# Patient Record
Sex: Male | Born: 1970 | Race: White | Hispanic: No | Marital: Married
Health system: Southern US, Community
[De-identification: ages and names within clinical notes are randomized; demographics above are authoritative.]

## PROBLEM LIST (undated history)

## (undated) DIAGNOSIS — E119 Type 2 diabetes mellitus without complications: Secondary | ICD-10-CM

---

## 1997-11-12 ENCOUNTER — Encounter: Admission: RE | Admit: 1997-11-12 | Discharge: 1998-02-10 | Payer: Self-pay

## 2001-05-16 ENCOUNTER — Encounter: Admission: RE | Admit: 2001-05-16 | Discharge: 2001-05-23 | Payer: Self-pay | Admitting: Family Medicine

## 2001-07-16 ENCOUNTER — Encounter
Admission: RE | Admit: 2001-07-16 | Discharge: 2001-08-05 | Payer: Self-pay | Admitting: Physical Medicine and Rehabilitation

## 2003-02-23 ENCOUNTER — Ambulatory Visit (HOSPITAL_COMMUNITY): Admission: RE | Admit: 2003-02-23 | Discharge: 2003-02-23 | Payer: Self-pay | Admitting: Internal Medicine

## 2003-08-31 ENCOUNTER — Ambulatory Visit (HOSPITAL_COMMUNITY): Admission: RE | Admit: 2003-08-31 | Discharge: 2003-08-31 | Payer: Self-pay | Admitting: Family Medicine

## 2004-06-14 ENCOUNTER — Ambulatory Visit: Payer: Self-pay | Admitting: Family Medicine

## 2004-07-05 ENCOUNTER — Ambulatory Visit: Payer: Self-pay | Admitting: Family Medicine

## 2004-08-22 ENCOUNTER — Ambulatory Visit: Payer: Self-pay | Admitting: Family Medicine

## 2004-09-12 ENCOUNTER — Ambulatory Visit: Payer: Self-pay | Admitting: Family Medicine

## 2004-10-06 ENCOUNTER — Ambulatory Visit: Payer: Self-pay | Admitting: Family Medicine

## 2004-11-15 ENCOUNTER — Ambulatory Visit: Payer: Self-pay | Admitting: Family Medicine

## 2005-01-04 ENCOUNTER — Ambulatory Visit: Payer: Self-pay | Admitting: Family Medicine

## 2005-04-06 ENCOUNTER — Ambulatory Visit: Payer: Self-pay | Admitting: Family Medicine

## 2005-06-13 ENCOUNTER — Encounter: Admission: RE | Admit: 2005-06-13 | Discharge: 2005-06-29 | Payer: Self-pay | Admitting: Family Medicine

## 2005-07-14 ENCOUNTER — Ambulatory Visit (HOSPITAL_COMMUNITY): Admission: RE | Admit: 2005-07-14 | Discharge: 2005-07-14 | Payer: Self-pay | Admitting: Family Medicine

## 2007-03-14 ENCOUNTER — Encounter: Admission: RE | Admit: 2007-03-14 | Discharge: 2007-06-12 | Payer: Self-pay | Admitting: Family Medicine

## 2008-09-23 ENCOUNTER — Encounter
Admission: RE | Admit: 2008-09-23 | Discharge: 2008-10-27 | Payer: Self-pay | Admitting: Physical Medicine & Rehabilitation

## 2008-09-25 ENCOUNTER — Ambulatory Visit: Payer: Self-pay | Admitting: Physical Medicine & Rehabilitation

## 2008-10-06 ENCOUNTER — Encounter
Admission: RE | Admit: 2008-10-06 | Discharge: 2008-10-12 | Payer: Self-pay | Admitting: Physical Medicine & Rehabilitation

## 2008-10-12 ENCOUNTER — Emergency Department (HOSPITAL_COMMUNITY): Admission: EM | Admit: 2008-10-12 | Discharge: 2008-10-12 | Payer: Self-pay | Admitting: Emergency Medicine

## 2008-10-19 ENCOUNTER — Encounter: Admission: RE | Admit: 2008-10-19 | Discharge: 2008-10-20 | Payer: Self-pay | Admitting: Anesthesiology

## 2008-10-20 ENCOUNTER — Ambulatory Visit: Payer: Self-pay | Admitting: Anesthesiology

## 2008-10-27 ENCOUNTER — Ambulatory Visit: Payer: Self-pay | Admitting: Physical Medicine & Rehabilitation

## 2009-08-03 ENCOUNTER — Encounter: Admission: RE | Admit: 2009-08-03 | Discharge: 2009-08-12 | Payer: Self-pay | Admitting: Neurosurgery

## 2009-08-16 ENCOUNTER — Encounter: Admission: RE | Admit: 2009-08-16 | Discharge: 2009-11-14 | Payer: Self-pay | Admitting: Neurosurgery

## 2009-09-06 ENCOUNTER — Emergency Department (HOSPITAL_COMMUNITY): Admission: EM | Admit: 2009-09-06 | Discharge: 2009-09-06 | Payer: Self-pay | Admitting: Emergency Medicine

## 2010-06-16 ENCOUNTER — Encounter
Admission: RE | Admit: 2010-06-16 | Discharge: 2010-08-11 | Payer: Self-pay | Source: Home / Self Care | Attending: Family Medicine | Admitting: Family Medicine

## 2010-08-14 ENCOUNTER — Encounter: Admission: RE | Admit: 2010-08-14 | Payer: Self-pay | Source: Home / Self Care | Admitting: Family Medicine

## 2010-09-03 ENCOUNTER — Encounter: Payer: Self-pay | Admitting: Family Medicine

## 2010-10-30 LAB — BASIC METABOLIC PANEL
BUN: 8 mg/dL (ref 6–23)
CO2: 24 mEq/L (ref 19–32)
Calcium: 9.4 mg/dL (ref 8.4–10.5)
Chloride: 101 mEq/L (ref 96–112)
Creatinine, Ser: 0.83 mg/dL (ref 0.4–1.5)
GFR calc Af Amer: >60 mL/min (ref >60)
GFR calc non Af Amer: >60 mL/min (ref >60)
Glucose, Bld: 119 mg/dL — ABNORMAL HIGH (ref 70–99)
Potassium: 3.9 mEq/L (ref 3.5–5.1)
Sodium: 133 mEq/L — ABNORMAL LOW (ref 135–145)

## 2010-10-30 LAB — DIFFERENTIAL
Basophils Absolute: 0 10*3/uL (ref 0.0–0.1)
Basophils Relative: 0 % (ref 0–1)
Eosinophils Absolute: 0 10*3/uL (ref 0.0–0.7)
Eosinophils Relative: 1 % (ref 0–5)
Lymphocytes Relative: 16 % (ref 12–46)
Lymphs Abs: 1.5 10*3/uL (ref 0.7–4.0)
Monocytes Absolute: 0.7 10*3/uL (ref 0.1–1.0)
Monocytes Relative: 8 % (ref 3–12)
Neutro Abs: 7.1 10*3/uL (ref 1.7–7.7)
Neutrophils Relative %: 75 % (ref 43–77)

## 2010-10-30 LAB — CBC
HCT: 48.4 % (ref 39.0–52.0)
Hemoglobin: 16.5 g/dL (ref 13.0–17.0)
MCHC: 34.2 g/dL (ref 30.0–36.0)
MCV: 88.5 fL (ref 78.0–100.0)
Platelets: 239 10*3/uL (ref 150–400)
RBC: 5.46 MIL/uL (ref 4.22–5.81)
RDW: 13.2 % (ref 11.5–15.5)
WBC: 9.4 10*3/uL (ref 4.0–10.5)

## 2010-10-30 LAB — POCT CARDIAC MARKERS
CKMB, poc: <1 ng/mL — ABNORMAL LOW (ref 1.0–8.0)
CKMB, poc: <1 ng/mL — ABNORMAL LOW (ref 1.0–8.0)
Myoglobin, poc: 60.3 ng/mL (ref 12–200)
Myoglobin, poc: 64.1 ng/mL (ref 12–200)
Troponin i, poc: <0.05 ng/mL (ref 0.00–0.09)
Troponin i, poc: <0.05 ng/mL (ref 0.00–0.09)

## 2010-10-30 LAB — PROTIME-INR
INR: 0.94 (ref 0.00–1.49)
Prothrombin Time: 12.5 seconds (ref 11.6–15.2)
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
## 2010-12-27 NOTE — Assessment & Plan Note (Signed)
Alexander Torres follows up today.  Alexander Torres had cervical facet injections per Dr.  Stevphen Rochester on October 20, 2008, which was not particularly effective.  Has a  history of cervical spine pain.  MRI showing minimal spinal stenosis at  C7-8, some left-sided stenosis in upper levels, but none of this severe.  Nerve conduction studies showed some carpal tunnel type findings, but no  other signs of cervical radiculopathy.   Alexander Torres is wanting to go back to work and is wondering whether this would be  deleterious to his condition.   His pain is about 5 currently, interferes with activity at a 6.  Pain  does improve with rest, heat, medications.  Alexander Torres walks 20 minutes at a  time, climbs steps.  Alexander Torres drives.  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  Alexander Torres has some difficulty with certain shopping and  household duties.   His review of systems is positive for numbness, tingling, spasms,  anxiety, but not depression.   Past history of diabetes.   SOCIAL HISTORY:  Married.  Smokes a pack per day.  Denies any alcohol or  drug use.  Urine drug screen performed a month ago was negative for any  illicit drugs or nondisclosed opiates.   Alexander Torres has not done well with non-narcotic agents, but has had reasonable  relief with hydrocodone small doses, i.e. 5/325 t.i.d.   IMPRESSION:  Cervicalgia.  Alexander Torres has some cervical spondylosis without  myelopathy while Alexander Torres may experience some exacerbation of pain when Alexander Torres  first goes back to work, I do not think it will in the long-term cause  any additional degenerative changes in his neck.  We will write for some  hydrocodone 5/325, try to avoid this usage during work hours.  Alexander Torres takes  Skelaxin 800 t.i.d.  I will see him back in couple months, discussed  that cervical spine problems in terms of pain can take several months to  improve.  Alexander Torres already has some exercises from physical therapy.  We will  follow him up in 2 months.      Erick Colace, M.D.  Electronically  Signed     AEK/MedQ  D:  10/27/2008 16:51:52  T:  10/28/2008 03:56:55  Job #:  [id number]   cc:   Dr. Felipe Drone

## 2010-12-27 NOTE — Procedures (Signed)
Alexander Torres                 ACCOUNT NO.:  [id number]   MEDICAL RECORD NO.:  [id number]          PATIENT TYPE:  REC   LOCATION:  [facility]                          FACILITY:  [facility]   PHYSICIAN:  Celene Kras, MD        DATE OF BIRTH:  December 09, 1970   DATE OF PROCEDURE:  DATE OF DISCHARGE:                               OPERATIVE REPORT   [facility].  I evaluated  him and reviewed the Health and history form and 14-point review of  systems.   1. He described it is spondylytic pain, particularly to the right,      difficulty with endurance and range of motion activities.  He works      as a Production designer, theatre/television/film man, up and down ladders, the like, and cautions      with this type of work, review of medications and cautions there.      Rationale to perform intervention to minimize escalation of      controlled substances, and to remain as functional as possible.  2. Modifiable features in health profile such as cigarette cessation      is strongly urged.  3. Review of imaging and progress to date.  He has spondylytic pain,      minimal arm pain, directed to the right, consistent with 5, 6, and      7; 4 will be blocked as contributory innervation, and I have given      him benchmarks 50-75%, and RF could be entertained down the road.      I will see him back in 2-4 weeks to determine further course of      care.  He is consented for today's procedure.  I have used models      and discussed in lay terms.   Objectively, diffuse paracervical myofascial discomfort with positive  cervical facet compression test, right greater than left with  suprascapular and levator scapular pain.  Nothing new neurologically,  spondylytic pain, minimal myelopathy.   IMPRESSION:  Spondylosis.   PLAN:  Facet medial branch intervention 4, 5, 6, and 7, right side under  local anesthetic and he is consented.  Predicated further intervention  based on need and overall  response, independent needle access point.   The patient was taken to the fluoroscopy suite and placed in supine  position.  The neck was prepped and draped in the usual fashion.  Using  a 25-gauge needle, I advanced to the facet at the medial branch of C4,  5, 6, and 7, right side under local anesthetic.  Confirmed placement and  injected 0.5 mL of lidocaine, 1% MPF at each level with a total 40 mg of  Aristocort in divided dose.   He tolerated the procedure well.  No complications from our procedures,  except within the context of activities of daily living.           ______________________________  Celene Kras, MD     HH/MEDQ  D:  10/20/2008 12:10:47  T:  10/20/2008 23:04:01  Job:  [id number]

## 2010-12-30 NOTE — Op Note (Signed)
NAME:  Alexander Torres, Alexander Torres                           ACCOUNT NO.:  [id number]   MEDICAL RECORD NO.:  [id number]                   PATIENT TYPE:  AMB   LOCATION:  DAY                                  FACILITY:  [facility]   PHYSICIAN:  Lionel December, M.D.                 DATE OF BIRTH:  Feb 18, 1971   DATE OF PROCEDURE:  02/23/2003  DATE OF DISCHARGE:                                 OPERATIVE REPORT   PROCEDURE:   ENDOSCOPIST:  Lionel December, M.D.   INDICATIONS:  [name] is a 40 year old Caucasian male with a several year  history of GERD who has been on therapy for 6-7 years.  He is on Nexium 40  mg a day and still having some epigastric pain as well as chest discomfort  and postprandial nausea, although he is a lot better since he has been  switched from 40 to Nexium.  Recent ultrasound was normal.  He is undergoing  EGD to make sure that he does not have Barrett's esophagus or a large hernia  in which case management may be different.  The procedure and risks were  reviewed with the patient and informed consent was obtained.   PREOPERATIVE MEDICATIONS:  Cetacaine spray for oropharyngeal topical  anesthesia, Demerol 50 mg IV and Versed 10 mg IV in divided dose.   FINDINGS:  Procedure performed in endoscopy suite.  The patient's vital  signs and O2 saturation were monitored during the procedure and remained  stable.  The patient was placed in the left lateral recumbent position and  Olympus videoscope was passed via the oropharynx without any difficulty into  the esophagus.   ESOPHAGUS:  Mucosa of the esophagus normal except there is focal erythema at  GE junction.  There were no erosions or ulcers noted.  There was a small  sliding hiatal hernia, no more than 3 cm in length.   STOMACH:  It was empty and distended very well with insufflation.  The folds  of the proximal stomach were normal.  Examination of the mucosa at body,  antrum, pyloric channel, as well as angularis, fundus, and cardia  was  normal.   DUODENUM:  Examination of the bulb revealed normal mucosa.  The scope was  passed into the second part of the duodenum where the mucosa and folds were  normal.   The endoscope was withdrawn.  The patient tolerated the procedure well.   FINAL DIAGNOSES:  1. 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  No evidence of Barrett's esophagus.  2. Normal examination of stomach and first and second part of the duodenum.    RECOMMENDATIONS:  1. He will continue antireflux measures on Nexium at 40 mg p.o. q.a.m.  2. He will return for OV if he continues to have breakthrough symptoms.  Lionel December, M.D.    NR/MEDQ  D:  02/23/2003  T:  02/23/2003  Job:  [id number]   cc:   Delaney Meigs, M.D.  [address]  [place]  [place] [address]  Fax: [phone]

## 2012-10-29 ENCOUNTER — Ambulatory Visit: Payer: BC Managed Care – PPO | Attending: Family Medicine | Admitting: Physical Therapy

## 2012-10-29 DIAGNOSIS — R5381 Other malaise: Secondary | ICD-10-CM | POA: Insufficient documentation

## 2012-10-29 DIAGNOSIS — IMO0001 Reserved for inherently not codable concepts without codable children: Secondary | ICD-10-CM | POA: Insufficient documentation

## 2012-10-29 DIAGNOSIS — M545 Low back pain, unspecified: Secondary | ICD-10-CM | POA: Insufficient documentation

## 2012-11-04 ENCOUNTER — Encounter: Payer: BC Managed Care – PPO | Admitting: Physical Therapy

## 2013-01-15 ENCOUNTER — Ambulatory Visit: Payer: PRIVATE HEALTH INSURANCE | Attending: Family Medicine | Admitting: Physical Therapy

## 2013-01-15 DIAGNOSIS — M545 Low back pain, unspecified: Secondary | ICD-10-CM | POA: Insufficient documentation

## 2013-01-15 DIAGNOSIS — IMO0001 Reserved for inherently not codable concepts without codable children: Secondary | ICD-10-CM | POA: Insufficient documentation

## 2013-01-15 DIAGNOSIS — R5381 Other malaise: Secondary | ICD-10-CM | POA: Insufficient documentation

## 2013-01-15 DIAGNOSIS — Z9181 History of falling: Secondary | ICD-10-CM | POA: Insufficient documentation

## 2013-02-10 DIAGNOSIS — R079 Chest pain, unspecified: Secondary | ICD-10-CM

## 2017-10-16 ENCOUNTER — Encounter (HOSPITAL_COMMUNITY): Payer: Self-pay | Admitting: Cardiovascular Disease

## 2017-10-16 ENCOUNTER — Inpatient Hospital Stay (HOSPITAL_COMMUNITY)
Admission: EM | Admit: 2017-10-16 | Discharge: 2017-11-12 | DRG: 246 | Disposition: E | Payer: BC Managed Care – PPO | Source: Ambulatory Visit | Attending: Pulmonary Disease | Admitting: Pulmonary Disease

## 2017-10-16 ENCOUNTER — Inpatient Hospital Stay (HOSPITAL_COMMUNITY): Payer: BC Managed Care – PPO

## 2017-10-16 ENCOUNTER — Encounter (HOSPITAL_COMMUNITY): Admission: EM | Disposition: E | Payer: Self-pay | Source: Ambulatory Visit | Attending: Pulmonary Disease

## 2017-10-16 DIAGNOSIS — Z8674 Personal history of sudden cardiac arrest: Secondary | ICD-10-CM

## 2017-10-16 DIAGNOSIS — I340 Nonrheumatic mitral (valve) insufficiency: Secondary | ICD-10-CM | POA: Diagnosis not present

## 2017-10-16 DIAGNOSIS — E785 Hyperlipidemia, unspecified: Secondary | ICD-10-CM | POA: Diagnosis present

## 2017-10-16 DIAGNOSIS — E1165 Type 2 diabetes mellitus with hyperglycemia: Secondary | ICD-10-CM | POA: Diagnosis present

## 2017-10-16 DIAGNOSIS — R569 Unspecified convulsions: Secondary | ICD-10-CM | POA: Diagnosis present

## 2017-10-16 DIAGNOSIS — I472 Ventricular tachycardia: Secondary | ICD-10-CM | POA: Diagnosis not present

## 2017-10-16 DIAGNOSIS — R402212 Coma scale, best verbal response, none, at arrival to emergency department: Secondary | ICD-10-CM | POA: Diagnosis present

## 2017-10-16 DIAGNOSIS — M542 Cervicalgia: Secondary | ICD-10-CM | POA: Diagnosis present

## 2017-10-16 DIAGNOSIS — I469 Cardiac arrest, cause unspecified: Secondary | ICD-10-CM

## 2017-10-16 DIAGNOSIS — I255 Ischemic cardiomyopathy: Secondary | ICD-10-CM | POA: Diagnosis present

## 2017-10-16 DIAGNOSIS — N179 Acute kidney failure, unspecified: Secondary | ICD-10-CM | POA: Diagnosis not present

## 2017-10-16 DIAGNOSIS — I4901 Ventricular fibrillation: Secondary | ICD-10-CM | POA: Diagnosis not present

## 2017-10-16 DIAGNOSIS — R6521 Severe sepsis with septic shock: Secondary | ICD-10-CM | POA: Diagnosis not present

## 2017-10-16 DIAGNOSIS — R001 Bradycardia, unspecified: Secondary | ICD-10-CM | POA: Diagnosis not present

## 2017-10-16 DIAGNOSIS — R402112 Coma scale, eyes open, never, at arrival to emergency department: Secondary | ICD-10-CM | POA: Diagnosis present

## 2017-10-16 DIAGNOSIS — J9690 Respiratory failure, unspecified, unspecified whether with hypoxia or hypercapnia: Secondary | ICD-10-CM

## 2017-10-16 DIAGNOSIS — S2241XA Multiple fractures of ribs, right side, initial encounter for closed fracture: Secondary | ICD-10-CM | POA: Diagnosis present

## 2017-10-16 DIAGNOSIS — G8929 Other chronic pain: Secondary | ICD-10-CM | POA: Diagnosis present

## 2017-10-16 DIAGNOSIS — A419 Sepsis, unspecified organism: Secondary | ICD-10-CM | POA: Diagnosis not present

## 2017-10-16 DIAGNOSIS — J80 Acute respiratory distress syndrome: Secondary | ICD-10-CM | POA: Diagnosis not present

## 2017-10-16 DIAGNOSIS — R34 Anuria and oliguria: Secondary | ICD-10-CM | POA: Diagnosis not present

## 2017-10-16 DIAGNOSIS — I2510 Atherosclerotic heart disease of native coronary artery without angina pectoris: Secondary | ICD-10-CM | POA: Diagnosis present

## 2017-10-16 DIAGNOSIS — J9601 Acute respiratory failure with hypoxia: Secondary | ICD-10-CM | POA: Diagnosis present

## 2017-10-16 DIAGNOSIS — G40901 Epilepsy, unspecified, not intractable, with status epilepticus: Secondary | ICD-10-CM | POA: Diagnosis not present

## 2017-10-16 DIAGNOSIS — J9600 Acute respiratory failure, unspecified whether with hypoxia or hypercapnia: Secondary | ICD-10-CM

## 2017-10-16 DIAGNOSIS — I4891 Unspecified atrial fibrillation: Secondary | ICD-10-CM | POA: Diagnosis present

## 2017-10-16 DIAGNOSIS — J690 Pneumonitis due to inhalation of food and vomit: Secondary | ICD-10-CM | POA: Diagnosis not present

## 2017-10-16 DIAGNOSIS — F17210 Nicotine dependence, cigarettes, uncomplicated: Secondary | ICD-10-CM | POA: Diagnosis present

## 2017-10-16 DIAGNOSIS — E872 Acidosis: Secondary | ICD-10-CM | POA: Diagnosis not present

## 2017-10-16 DIAGNOSIS — G931 Anoxic brain damage, not elsewhere classified: Secondary | ICD-10-CM | POA: Diagnosis present

## 2017-10-16 DIAGNOSIS — X58XXXA Exposure to other specified factors, initial encounter: Secondary | ICD-10-CM | POA: Diagnosis present

## 2017-10-16 DIAGNOSIS — I2119 ST elevation (STEMI) myocardial infarction involving other coronary artery of inferior wall: Principal | ICD-10-CM | POA: Diagnosis present

## 2017-10-16 DIAGNOSIS — Z9289 Personal history of other medical treatment: Secondary | ICD-10-CM

## 2017-10-16 DIAGNOSIS — I213 ST elevation (STEMI) myocardial infarction of unspecified site: Secondary | ICD-10-CM

## 2017-10-16 DIAGNOSIS — R402312 Coma scale, best motor response, none, at arrival to emergency department: Secondary | ICD-10-CM | POA: Diagnosis present

## 2017-10-16 DIAGNOSIS — E874 Mixed disorder of acid-base balance: Secondary | ICD-10-CM | POA: Diagnosis not present

## 2017-10-16 DIAGNOSIS — I2121 ST elevation (STEMI) myocardial infarction involving left circumflex coronary artery: Secondary | ICD-10-CM

## 2017-10-16 DIAGNOSIS — Z880 Allergy status to penicillin: Secondary | ICD-10-CM

## 2017-10-16 DIAGNOSIS — R0902 Hypoxemia: Secondary | ICD-10-CM

## 2017-10-16 HISTORY — PX: LEFT HEART CATH AND CORONARY ANGIOGRAPHY: CATH118249

## 2017-10-16 HISTORY — PX: CORONARY/GRAFT ACUTE MI REVASCULARIZATION: CATH118305

## 2017-10-16 HISTORY — DX: Type 2 diabetes mellitus without complications: E11.9

## 2017-10-16 LAB — PROTIME-INR
INR: 1.09
INR: 1.23
Prothrombin Time: 14 seconds (ref 11.4–15.2)
Prothrombin Time: 15.4 seconds — ABNORMAL HIGH (ref 11.4–15.2)

## 2017-10-16 LAB — GLUCOSE, CAPILLARY
Glucose-Capillary: 157 mg/dL — ABNORMAL HIGH (ref 65–99)
Glucose-Capillary: 135 mg/dL — ABNORMAL HIGH (ref 65–99)
Glucose-Capillary: 154 mg/dL — ABNORMAL HIGH (ref 65–99)
Glucose-Capillary: 140 mg/dL — ABNORMAL HIGH (ref 65–99)

## 2017-10-16 LAB — ECHOCARDIOGRAM COMPLETE
HEIGHTINCHES: 71.5 in
Weight: 3880.1 oz

## 2017-10-16 LAB — HIV ANTIBODY (ROUTINE TESTING W REFLEX): HIV SCREEN 4TH GENERATION: NONREACTIVE

## 2017-10-16 LAB — BASIC METABOLIC PANEL
Anion gap: 19 — ABNORMAL HIGH (ref 5–15)
Anion gap: 16 — ABNORMAL HIGH (ref 5–15)
BUN: 7 mg/dL (ref 6–20)
BUN: 8 mg/dL (ref 6–20)
CALCIUM: 9 mg/dL (ref 8.9–10.3)
CHLORIDE: 103 mmol/L (ref 101–111)
CO2: 15 mmol/L — AB (ref 22–32)
CO2: 18 mmol/L — AB (ref 22–32)
CREATININE: 1.09 mg/dL (ref 0.61–1.24)
Calcium: 8.7 mg/dL — ABNORMAL LOW (ref 8.9–10.3)
Chloride: 104 mmol/L (ref 101–111)
Creatinine, Ser: 0.83 mg/dL (ref 0.61–1.24)
GFR calc non Af Amer: >60 mL/min (ref >60)
GFR calc non Af Amer: >60 mL/min (ref >60)
GLUCOSE: 260 mg/dL — AB (ref 65–99)
Glucose, Bld: 165 mg/dL — ABNORMAL HIGH (ref 65–99)
Potassium: 3.9 mmol/L (ref 3.5–5.1)
Potassium: 3.8 mmol/L (ref 3.5–5.1)
Sodium: 137 mmol/L (ref 135–145)
Sodium: 138 mmol/L (ref 135–145)

## 2017-10-16 LAB — HEMOGLOBIN A1C
Hgb A1c MFr Bld: 6.3 % — ABNORMAL HIGH (ref 4.8–5.6)
Mean Plasma Glucose: 134.11 mg/dL

## 2017-10-16 LAB — CBC WITH DIFFERENTIAL/PLATELET
BASOS ABS: 0 10*3/uL (ref 0.0–0.1)
BASOS PCT: 0 %
Eosinophils Absolute: 0 10*3/uL (ref 0.0–0.7)
Eosinophils Relative: 0 %
HCT: 42 % (ref 39.0–52.0)
Hemoglobin: 14.2 g/dL (ref 13.0–17.0)
Lymphocytes Relative: 12 %
Lymphs Abs: 3.6 10*3/uL (ref 0.7–4.0)
MCH: 30.7 pg (ref 26.0–34.0)
MCHC: 33.8 g/dL (ref 30.0–36.0)
MCV: 90.9 fL (ref 78.0–100.0)
MONO ABS: 1.5 10*3/uL — AB (ref 0.1–1.0)
Monocytes Relative: 5 %
NEUTROS PCT: 83 %
Neutro Abs: 24.9 10*3/uL — ABNORMAL HIGH (ref 1.7–7.7)
PLATELETS: 357 10*3/uL (ref 150–400)
RBC: 4.62 MIL/uL (ref 4.22–5.81)
RDW: 13.7 % (ref 11.5–15.5)
WBC: 30 10*3/uL — ABNORMAL HIGH (ref 4.0–10.5)

## 2017-10-16 LAB — POCT I-STAT 3, ART BLOOD GAS (G3+)
ACID-BASE DEFICIT: 3 mmol/L — AB (ref 0.0–2.0)
Acid-base deficit: 8 mmol/L — ABNORMAL HIGH (ref 0.0–2.0)
BICARBONATE: 18.5 mmol/L — AB (ref 20.0–28.0)
BICARBONATE: 21.3 mmol/L (ref 20.0–28.0)
O2 SAT: 98 %
O2 Saturation: 100 %
PCO2 ART: 39.6 mmHg (ref 32.0–48.0)
PH ART: 7.272 — AB (ref 7.350–7.450)
PO2 ART: 95 mmHg (ref 83.0–108.0)
Patient temperature: 96.8
TCO2: 20 mmol/L — ABNORMAL LOW (ref 22–32)
TCO2: 22 mmol/L (ref 22–32)
pCO2 arterial: 34.1 mmHg (ref 32.0–48.0)
pH, Arterial: 7.398 (ref 7.350–7.450)
pO2, Arterial: 229 mmHg — ABNORMAL HIGH (ref 83.0–108.0)

## 2017-10-16 LAB — TROPONIN I
TROPONIN I: 0.39 ng/mL — AB (ref <0.03)
Troponin I: 22.17 ng/mL (ref <0.03)
Troponin I: 47.59 ng/mL (ref <0.03)
Troponin I: 39.1 ng/mL (ref <0.03)

## 2017-10-16 LAB — POCT ACTIVATED CLOTTING TIME: Activated Clotting Time: 802 seconds

## 2017-10-16 LAB — MRSA PCR SCREENING: MRSA by PCR: NEGATIVE

## 2017-10-16 LAB — LIPID PANEL
CHOL/HDL RATIO: 7.8 ratio
CHOLESTEROL: 226 mg/dL — AB (ref 0–200)
HDL: 29 mg/dL — ABNORMAL LOW (ref >40)
LDL Cholesterol: 167 mg/dL — ABNORMAL HIGH (ref 0–99)
Triglycerides: 148 mg/dL (ref <150)
VLDL: 30 mg/dL (ref 0–40)

## 2017-10-16 LAB — HEPATIC FUNCTION PANEL
ALBUMIN: 3.6 g/dL (ref 3.5–5.0)
ALK PHOS: 68 U/L (ref 38–126)
ALT: 61 U/L (ref 17–63)
AST: 129 U/L — AB (ref 15–41)
BILIRUBIN TOTAL: 0.7 mg/dL (ref 0.3–1.2)
Total Protein: 6.3 g/dL — ABNORMAL LOW (ref 6.5–8.1)

## 2017-10-16 LAB — RAPID URINE DRUG SCREEN, HOSP PERFORMED
Amphetamines: NOT DETECTED
BENZODIAZEPINES: POSITIVE — AB
Barbiturates: NOT DETECTED
Cocaine: NOT DETECTED
Opiates: NOT DETECTED
Tetrahydrocannabinol: NOT DETECTED

## 2017-10-16 LAB — PHOSPHORUS: PHOSPHORUS: 5.4 mg/dL — AB (ref 2.5–4.6)

## 2017-10-16 LAB — APTT
aPTT: 31 seconds (ref 24–36)
aPTT: 48 seconds — ABNORMAL HIGH (ref 24–36)

## 2017-10-16 LAB — MAGNESIUM: Magnesium: 1.9 mg/dL (ref 1.7–2.4)

## 2017-10-16 SURGERY — CORONARY/GRAFT ACUTE MI REVASCULARIZATION
Anesthesia: LOCAL

## 2017-10-16 MED ORDER — AMIODARONE LOAD VIA INFUSION
150.0000 mg | Freq: Once | INTRAVENOUS | Status: AC
  Administered 2017-10-16: 150 mg via INTRAVENOUS
  Filled 2017-10-16: qty 83.34

## 2017-10-16 MED ORDER — LABETALOL HCL 5 MG/ML IV SOLN: 10.0000 mg | INTRAVENOUS | Status: AC | PRN

## 2017-10-16 MED ORDER — SODIUM CHLORIDE 0.9 % IV SOLN
INTRAVENOUS | Status: DC
  Administered 2017-10-16 – 2017-10-18 (×2): via INTRAVENOUS

## 2017-10-16 MED ORDER — SODIUM CHLORIDE 0.9% FLUSH
3.0000 mL | INTRAVENOUS | Status: DC | PRN
Start: 2017-10-16 — End: 2017-10-16

## 2017-10-16 MED ORDER — ATORVASTATIN CALCIUM 80 MG PO TABS
80.0000 mg | ORAL_TABLET | Freq: Every day | ORAL | Status: DC
  Administered 2017-10-16 – 2017-10-18 (×3): 80 mg via ORAL
  Filled 2017-10-16 (×3): qty 1

## 2017-10-16 MED ORDER — CANGRELOR TETRASODIUM 50 MG IV SOLR
INTRAVENOUS | Status: AC
  Filled 2017-10-16: qty 50

## 2017-10-16 MED ORDER — SODIUM CHLORIDE 0.9 % IV SOLN: 250.0000 mL | INTRAVENOUS | Status: DC | PRN

## 2017-10-16 MED ORDER — ONDANSETRON HCL 4 MG/2ML IJ SOLN: 4.0000 mg | Freq: Four times a day (QID) | INTRAMUSCULAR | Status: DC | PRN

## 2017-10-16 MED ORDER — LEVETIRACETAM IN NACL 1000 MG/100ML IV SOLN
1000.0000 mg | Freq: Two times a day (BID) | INTRAVENOUS | Status: DC
  Administered 2017-10-16 – 2017-10-17 (×2): 1000 mg via INTRAVENOUS
  Filled 2017-10-16 (×5): qty 100

## 2017-10-16 MED ORDER — DOCUSATE SODIUM 50 MG/5ML PO LIQD: 100.0000 mg | Freq: Two times a day (BID) | ORAL | Status: DC | PRN

## 2017-10-16 MED ORDER — CHLORHEXIDINE GLUCONATE 0.12% ORAL RINSE (MEDLINE KIT)
15.0000 mL | Freq: Two times a day (BID) | OROMUCOSAL | Status: DC
  Administered 2017-10-16 – 2017-10-18 (×5): 15 mL via OROMUCOSAL

## 2017-10-16 MED ORDER — SENNOSIDES 8.8 MG/5ML PO SYRP
5.0000 mL | ORAL_SOLUTION | Freq: Every day | ORAL | Status: DC
  Administered 2017-10-16 – 2017-10-18 (×3): 5 mL
  Filled 2017-10-16 (×3): qty 5

## 2017-10-16 MED ORDER — BIVALIRUDIN BOLUS VIA INFUSION - CUPID
INTRAVENOUS | Status: DC | PRN
  Administered 2017-10-16: 82.5 mg via INTRAVENOUS

## 2017-10-16 MED ORDER — SODIUM CHLORIDE 0.9 % IV SOLN
INTRAVENOUS | Status: DC | PRN
  Administered 2017-10-16: 1.75 mg/kg/h via INTRAVENOUS

## 2017-10-16 MED ORDER — FENTANYL 2500MCG IN NS 250ML (10MCG/ML) PREMIX INFUSION
0.0000 ug/h | INTRAVENOUS | Status: DC
  Administered 2017-10-16: 200 ug/h via INTRAVENOUS
  Administered 2017-10-16 – 2017-10-17 (×2): 100 ug/h via INTRAVENOUS
  Administered 2017-10-17: 250 ug/h via INTRAVENOUS
  Administered 2017-10-18 (×2): 200 ug/h via INTRAVENOUS
  Filled 2017-10-16 (×10): qty 250

## 2017-10-16 MED ORDER — MAGNESIUM SULFATE 2 GM/50ML IV SOLN
2.0000 g | Freq: Once | INTRAVENOUS | Status: AC
  Administered 2017-10-16: 2 g via INTRAVENOUS
  Filled 2017-10-16: qty 50

## 2017-10-16 MED ORDER — ONDANSETRON HCL 4 MG/2ML IJ SOLN
INTRAMUSCULAR | Status: AC
  Filled 2017-10-16: qty 2

## 2017-10-16 MED ORDER — LIDOCAINE HCL (CARDIAC) 20 MG/ML IV SOLN
INTRAVENOUS | Status: DC | PRN
  Administered 2017-10-16: 100 mg via INTRAVENOUS

## 2017-10-16 MED ORDER — CISATRACURIUM BESYLATE (PF) 200 MG/20ML IV SOLN
1.0000 ug/kg/min | INTRAVENOUS | Status: DC
  Filled 2017-10-16: qty 20

## 2017-10-16 MED ORDER — CISATRACURIUM BOLUS VIA INFUSION
0.0500 mg/kg | INTRAVENOUS | Status: DC | PRN
  Filled 2017-10-16: qty 6

## 2017-10-16 MED ORDER — PROPOFOL 1000 MG/100ML IV EMUL
INTRAVENOUS | Status: AC
  Administered 2017-10-17: 30 ug/kg/min via INTRAVENOUS
  Filled 2017-10-16: qty 100

## 2017-10-16 MED ORDER — ASPIRIN 300 MG RE SUPP
300.0000 mg | RECTAL | Status: DC
  Filled 2017-10-16: qty 1

## 2017-10-16 MED ORDER — PERFLUTREN LIPID MICROSPHERE
1.0000 mL | INTRAVENOUS | Status: AC | PRN
  Administered 2017-10-16: 5 mL via INTRAVENOUS
  Filled 2017-10-16: qty 10

## 2017-10-16 MED ORDER — ASPIRIN 81 MG PO CHEW
81.0000 mg | CHEWABLE_TABLET | Freq: Every day | ORAL | Status: DC
  Administered 2017-10-17 – 2017-10-18 (×2): 81 mg via ORAL
  Filled 2017-10-16 (×2): qty 1

## 2017-10-16 MED ORDER — HYDRALAZINE HCL 20 MG/ML IJ SOLN: 5.0000 mg | INTRAMUSCULAR | Status: AC | PRN

## 2017-10-16 MED ORDER — PROPOFOL BOLUS VIA INFUSION
INTRAVENOUS | Status: DC | PRN
  Administered 2017-10-16 (×2): 20 mg via INTRAVENOUS

## 2017-10-16 MED ORDER — LORAZEPAM 2 MG/ML IJ SOLN
4.0000 mg | Freq: Once | INTRAMUSCULAR | Status: AC
  Administered 2017-10-16: 4 mg via INTRAVENOUS

## 2017-10-16 MED ORDER — AMIODARONE HCL 150 MG/3ML IV SOLN
INTRAVENOUS | Status: AC
  Filled 2017-10-16: qty 3

## 2017-10-16 MED ORDER — SODIUM CHLORIDE 0.9% FLUSH
3.0000 mL | Freq: Two times a day (BID) | INTRAVENOUS | Status: DC
  Administered 2017-10-16: 3 mL via INTRAVENOUS

## 2017-10-16 MED ORDER — ATROPINE SULFATE 1 MG/10ML IJ SOSY
PREFILLED_SYRINGE | INTRAMUSCULAR | Status: AC
  Filled 2017-10-16: qty 10

## 2017-10-16 MED ORDER — LIDOCAINE HCL (CARDIAC) 20 MG/ML IV SOLN
INTRAVENOUS | Status: AC
  Filled 2017-10-16: qty 5

## 2017-10-16 MED ORDER — PROPOFOL 1000 MG/100ML IV EMUL
5.0000 ug/kg/min | INTRAVENOUS | Status: DC
  Filled 2017-10-16: qty 100

## 2017-10-16 MED ORDER — ETOMIDATE 2 MG/ML IV SOLN
INTRAVENOUS | Status: AC | PRN
  Administered 2017-10-16: 20 mg via INTRAVENOUS

## 2017-10-16 MED ORDER — PROPOFOL 1000 MG/100ML IV EMUL
25.0000 ug/kg/min | INTRAVENOUS | Status: DC
  Administered 2017-10-16: 20 ug/kg/min via INTRAVENOUS
  Administered 2017-10-16: 40 ug/kg/min via INTRAVENOUS
  Administered 2017-10-16 (×2): 20 ug/kg/min via INTRAVENOUS
  Administered 2017-10-17: 40 ug/kg/min via INTRAVENOUS
  Administered 2017-10-17: 30 ug/kg/min via INTRAVENOUS
  Administered 2017-10-17: 25 ug/kg/min via INTRAVENOUS
  Administered 2017-10-18: 60 ug/kg/min via INTRAVENOUS
  Administered 2017-10-18 (×2): 80 ug/kg/min via INTRAVENOUS
  Administered 2017-10-18: 40 ug/kg/min via INTRAVENOUS
  Administered 2017-10-18: 60 ug/kg/min via INTRAVENOUS
  Administered 2017-10-18 (×3): 80 ug/kg/min via INTRAVENOUS
  Administered 2017-10-18 – 2017-10-19 (×2): 40 ug/kg/min via INTRAVENOUS
  Filled 2017-10-16 (×9): qty 100
  Filled 2017-10-16 (×3): qty 200
  Filled 2017-10-16: qty 100
  Filled 2017-10-16: qty 200
  Filled 2017-10-16 (×2): qty 100

## 2017-10-16 MED ORDER — LORAZEPAM 2 MG/ML IJ SOLN
INTRAMUSCULAR | Status: AC
  Administered 2017-10-16: 4 mg via INTRAVENOUS
  Filled 2017-10-16: qty 3

## 2017-10-16 MED ORDER — DOCUSATE SODIUM 50 MG/5ML PO LIQD
100.0000 mg | Freq: Two times a day (BID) | ORAL | Status: DC
  Administered 2017-10-16 – 2017-10-18 (×6): 100 mg
  Filled 2017-10-16 (×6): qty 10

## 2017-10-16 MED ORDER — TICAGRELOR 90 MG PO TABS
90.0000 mg | ORAL_TABLET | Freq: Two times a day (BID) | ORAL | Status: DC
  Administered 2017-10-16 – 2017-10-18 (×5): 90 mg via ORAL
  Filled 2017-10-16 (×5): qty 1

## 2017-10-16 MED ORDER — ORAL CARE MOUTH RINSE
15.0000 mL | Freq: Four times a day (QID) | OROMUCOSAL | Status: DC
  Administered 2017-10-16: 15 mL via OROMUCOSAL

## 2017-10-16 MED ORDER — IOPAMIDOL (ISOVUE-370) INJECTION 76%
INTRAVENOUS | Status: DC | PRN
  Administered 2017-10-16: 155 mL via INTRA_ARTERIAL

## 2017-10-16 MED ORDER — BIVALIRUDIN TRIFLUOROACETATE 250 MG IV SOLR
INTRAVENOUS | Status: AC
  Filled 2017-10-16: qty 250

## 2017-10-16 MED ORDER — INSULIN ASPART 100 UNIT/ML ~~LOC~~ SOLN
1.0000 [IU] | SUBCUTANEOUS | Status: DC
  Administered 2017-10-16 (×3): 2 [IU] via SUBCUTANEOUS
  Administered 2017-10-16 – 2017-10-17 (×2): 1 [IU] via SUBCUTANEOUS
  Administered 2017-10-17: 2 [IU] via SUBCUTANEOUS

## 2017-10-16 MED ORDER — VECURONIUM BROMIDE 10 MG IV SOLR
INTRAVENOUS | Status: AC
  Administered 2017-10-16: 10 mg via INTRAVENOUS
  Filled 2017-10-16: qty 10

## 2017-10-16 MED ORDER — MAGNESIUM SULFATE 50 % IJ SOLN
INTRAMUSCULAR | Status: AC
  Filled 2017-10-16: qty 2

## 2017-10-16 MED ORDER — MAGNESIUM SULFATE 50 % IJ SOLN
INTRAMUSCULAR | Status: DC | PRN
  Administered 2017-10-16: 1 g via INTRAVENOUS

## 2017-10-16 MED ORDER — AMIODARONE HCL IN DEXTROSE 360-4.14 MG/200ML-% IV SOLN
INTRAVENOUS | Status: AC
  Administered 2017-10-16: 150 mg/h via INTRAVENOUS
  Filled 2017-10-16: qty 200

## 2017-10-16 MED ORDER — ACETAMINOPHEN 160 MG/5ML PO SOLN
650.0000 mg | Freq: Four times a day (QID) | ORAL | Status: DC | PRN
  Administered 2017-10-17: 650 mg
  Filled 2017-10-16: qty 20.3

## 2017-10-16 MED ORDER — CHLORHEXIDINE GLUCONATE 0.12% ORAL RINSE (MEDLINE KIT)
15.0000 mL | Freq: Two times a day (BID) | OROMUCOSAL | Status: DC
  Administered 2017-10-16: 15 mL via OROMUCOSAL

## 2017-10-16 MED ORDER — SODIUM CHLORIDE 0.9 % IV SOLN
100.0000 ug/h | INTRAVENOUS | Status: DC
  Filled 2017-10-16: qty 50

## 2017-10-16 MED ORDER — VECURONIUM BROMIDE 10 MG IV SOLR
10.0000 mg | Freq: Once | INTRAVENOUS | Status: AC
  Administered 2017-10-16: 10 mg via INTRAVENOUS

## 2017-10-16 MED ORDER — ASPIRIN 600 MG RE SUPP
600.0000 mg | Freq: Once | RECTAL | Status: AC
  Administered 2017-10-16: 600 mg via RECTAL
  Filled 2017-10-16 (×2): qty 1

## 2017-10-16 MED ORDER — POTASSIUM CHLORIDE 10 MEQ/50ML IV SOLN
10.0000 meq | INTRAVENOUS | Status: AC
  Administered 2017-10-16 (×4): 10 meq via INTRAVENOUS
  Filled 2017-10-16 (×4): qty 50

## 2017-10-16 MED ORDER — AMIODARONE HCL IN DEXTROSE 360-4.14 MG/200ML-% IV SOLN
60.0000 mg/h | INTRAVENOUS | Status: AC
  Administered 2017-10-16: 150 mg/h via INTRAVENOUS
  Administered 2017-10-16: 60 mg/h via INTRAVENOUS
  Filled 2017-10-16 (×2): qty 200

## 2017-10-16 MED ORDER — HEPARIN SODIUM (PORCINE) 5000 UNIT/ML IJ SOLN
5000.0000 [IU] | Freq: Three times a day (TID) | INTRAMUSCULAR | Status: DC
  Administered 2017-10-16 – 2017-10-19 (×9): 5000 [IU] via SUBCUTANEOUS
  Filled 2017-10-16 (×9): qty 1

## 2017-10-16 MED ORDER — SUCCINYLCHOLINE CHLORIDE 20 MG/ML IJ SOLN
INTRAMUSCULAR | Status: AC | PRN
  Administered 2017-10-16: 200 mg via INTRAVENOUS

## 2017-10-16 MED ORDER — SODIUM CHLORIDE 0.9% FLUSH: 10.0000 mL | INTRAVENOUS | Status: DC | PRN

## 2017-10-16 MED ORDER — ORAL CARE MOUTH RINSE
15.0000 mL | OROMUCOSAL | Status: DC
  Administered 2017-10-16 – 2017-10-19 (×27): 15 mL via OROMUCOSAL

## 2017-10-16 MED ORDER — FENTANYL BOLUS VIA INFUSION
50.0000 ug | INTRAVENOUS | Status: DC | PRN
  Administered 2017-10-16 – 2017-10-17 (×4): 50 ug via INTRAVENOUS
  Filled 2017-10-16: qty 50

## 2017-10-16 MED ORDER — CANGRELOR BOLUS VIA INFUSION
INTRAVENOUS | Status: DC | PRN
  Administered 2017-10-16: 3300 ug via INTRAVENOUS

## 2017-10-16 MED ORDER — FENTANYL CITRATE (PF) 100 MCG/2ML IJ SOLN: 100.0000 ug | Freq: Once | INTRAMUSCULAR | Status: DC

## 2017-10-16 MED ORDER — ACETAMINOPHEN 650 MG RE SUPP
650.0000 mg | Freq: Four times a day (QID) | RECTAL | Status: DC | PRN
  Filled 2017-10-16: qty 1

## 2017-10-16 MED ORDER — SODIUM CHLORIDE 0.9% FLUSH
10.0000 mL | Freq: Two times a day (BID) | INTRAVENOUS | Status: DC
  Administered 2017-10-16: 10 mL
  Administered 2017-10-16: 20 mL
  Administered 2017-10-17 – 2017-10-18 (×3): 10 mL

## 2017-10-16 MED ORDER — HEPARIN (PORCINE) IN NACL 2-0.9 UNIT/ML-% IJ SOLN
INTRAMUSCULAR | Status: AC
  Filled 2017-10-16: qty 1000

## 2017-10-16 MED ORDER — IOPAMIDOL (ISOVUE-370) INJECTION 76%
INTRAVENOUS | Status: AC
  Filled 2017-10-16: qty 125

## 2017-10-16 MED ORDER — ARTIFICIAL TEARS OPHTHALMIC OINT
1.0000 "application " | TOPICAL_OINTMENT | Freq: Three times a day (TID) | OPHTHALMIC | Status: DC
  Administered 2017-10-16: 1 via OPHTHALMIC
  Filled 2017-10-16: qty 3.5

## 2017-10-16 MED ORDER — NITROGLYCERIN 1 MG/10 ML FOR IR/CATH LAB
INTRA_ARTERIAL | Status: AC
  Filled 2017-10-16: qty 10

## 2017-10-16 MED ORDER — STERILE WATER FOR INJECTION IJ SOLN
INTRAMUSCULAR | Status: AC
  Administered 2017-10-16: 10 mL
  Filled 2017-10-16: qty 10

## 2017-10-16 MED ORDER — ALBUTEROL SULFATE (2.5 MG/3ML) 0.083% IN NEBU: 2.5000 mg | INHALATION_SOLUTION | RESPIRATORY_TRACT | Status: DC | PRN

## 2017-10-16 MED ORDER — CANGRELOR TETRASODIUM 50 MG IV SOLR
4.0000 ug/kg/min | INTRAVENOUS | Status: AC
  Filled 2017-10-16: qty 50

## 2017-10-16 MED ORDER — AMIODARONE HCL IN DEXTROSE 360-4.14 MG/200ML-% IV SOLN
30.0000 mg/h | INTRAVENOUS | Status: DC
  Administered 2017-10-16 – 2017-10-19 (×8): 30 mg/h via INTRAVENOUS
  Filled 2017-10-16 (×6): qty 200

## 2017-10-16 MED ORDER — SODIUM CHLORIDE 0.9 % IV SOLN
INTRAVENOUS | Status: AC
  Administered 2017-10-16: 08:00:00 via INTRAVENOUS

## 2017-10-16 MED ORDER — SODIUM CHLORIDE 0.9 % IV SOLN
INTRAVENOUS | Status: AC | PRN
  Administered 2017-10-16: 4 ug/kg/min via INTRAVENOUS

## 2017-10-16 MED ORDER — VERAPAMIL HCL 2.5 MG/ML IV SOLN
INTRAVENOUS | Status: AC
  Filled 2017-10-16: qty 2

## 2017-10-16 MED ORDER — SODIUM CHLORIDE 0.9 % IV SOLN: INTRAVENOUS | Status: DC

## 2017-10-16 MED ORDER — CISATRACURIUM BOLUS VIA INFUSION
0.1000 mg/kg | Freq: Once | INTRAVENOUS | Status: DC
  Filled 2017-10-16: qty 11

## 2017-10-16 MED ORDER — NOREPINEPHRINE 4 MG/250ML-% IV SOLN
0.0000 ug/min | INTRAVENOUS | Status: DC
  Administered 2017-10-18: 2 ug/min via INTRAVENOUS
  Administered 2017-10-18: 50 ug/min via INTRAVENOUS
  Administered 2017-10-18: 28 ug/min via INTRAVENOUS
  Administered 2017-10-18: 50 ug/min via INTRAVENOUS
  Filled 2017-10-16 (×4): qty 250

## 2017-10-16 MED ORDER — HEPARIN (PORCINE) IN NACL 2-0.9 UNIT/ML-% IJ SOLN
INTRAMUSCULAR | Status: AC | PRN
  Administered 2017-10-16 (×3): 500 mL

## 2017-10-16 MED ORDER — CHLORHEXIDINE GLUCONATE CLOTH 2 % EX PADS
6.0000 | MEDICATED_PAD | Freq: Every day | CUTANEOUS | Status: DC
  Administered 2017-10-16 – 2017-10-19 (×4): 6 via TOPICAL

## 2017-10-16 MED ORDER — LIDOCAINE HCL (PF) 1 % IJ SOLN
INTRAMUSCULAR | Status: DC | PRN
  Administered 2017-10-16: 18 mL via INTRADERMAL

## 2017-10-16 MED ORDER — FAMOTIDINE 40 MG/5ML PO SUSR
20.0000 mg | Freq: Two times a day (BID) | ORAL | Status: DC
  Administered 2017-10-16 – 2017-10-18 (×6): 20 mg
  Filled 2017-10-16 (×6): qty 2.5

## 2017-10-16 MED ORDER — SODIUM CHLORIDE 0.9 % IV SOLN
INTRAVENOUS | Status: AC | PRN
  Administered 2017-10-16: 100 mL/h via INTRAVENOUS

## 2017-10-16 MED ORDER — TICAGRELOR 90 MG PO TABS
180.0000 mg | ORAL_TABLET | Freq: Once | ORAL | Status: AC
  Administered 2017-10-16: 180 mg via ORAL
  Filled 2017-10-16: qty 2

## 2017-10-16 MED ORDER — LIDOCAINE HCL 1 % IJ SOLN
INTRAMUSCULAR | Status: AC
  Filled 2017-10-16: qty 20

## 2017-10-16 SURGICAL SUPPLY — 16 items
BALLN SAPPHIRE 2.5X12 (BALLOONS) ×2
BALLN SAPPHIRE ~~LOC~~ 3.25X12 (BALLOONS) ×1 IMPLANT
BALLOON SAPPHIRE 2.5X12 (BALLOONS) IMPLANT
CATH INFINITI 5FR MULTPACK ANG (CATHETERS) ×1 IMPLANT
CATH VISTA GUIDE 6FR XB3 (CATHETERS) ×1 IMPLANT
CATH VISTA GUIDE 6FR XB3 SH (CATHETERS) ×1 IMPLANT
HOVERMATT SINGLE USE (MISCELLANEOUS) ×1 IMPLANT
KIT ENCORE 26 ADVANTAGE (KITS) ×1 IMPLANT
KIT HEART LEFT (KITS) ×2 IMPLANT
PACK CARDIAC CATHETERIZATION (CUSTOM PROCEDURE TRAY) ×2 IMPLANT
SHEATH PINNACLE 6F 10CM (SHEATH) ×1 IMPLANT
STENT SYNERGY DES 3X24 (Permanent Stent) ×1 IMPLANT
TRANSDUCER W/STOPCOCK (MISCELLANEOUS) ×2 IMPLANT
TUBING CIL FLEX 10 FLL-RA (TUBING) ×2 IMPLANT
WIRE COUGAR XT STRL 190CM (WIRE) ×1 IMPLANT
WIRE EMERALD 3MM-J .035X150CM (WIRE) ×1 IMPLANT

## 2017-10-16 NOTE — Progress Notes (Signed)
CRITICAL VALUE ALERT  Critical Value:  Troponin 39.10  Date & Time Notied:  23:23   Provider Notified: Dr. Warrick Parisiangan  Orders Received/Actions taken: no orders given

## 2017-10-16 NOTE — ED Notes (Signed)
Propofol started at 20mcg/kg/min

## 2017-10-16 NOTE — Progress Notes (Signed)
ETT was advanced to and resecured at  27cm per CXR results. The MD is aware and a repeat CXR has been ordered.

## 2017-10-16 NOTE — ED Notes (Signed)
Zofran 4 mg IVP given

## 2017-10-16 NOTE — ED Notes (Addendum)
BVM, patient intubated with 7.5 ETT and 26cm @ the lip. Color change noted, bilateral breath sounds. Blood drawn and sent to lab.

## 2017-10-16 NOTE — Procedures (Signed)
History: 47 year old male with a history of cardiac arrest  Sedation: Propofol  Technique: This is a 21 channel routine scalp EEG performed at the bedside with bipolar and monopolar montages arranged in accordance to the international 10/20 system of electrode placement. One channel was dedicated to EKG recording.    Background: The background is fairly monomorphic consisting of mild generalized irregular delta activity with no posterior dominant rhythm seen.  There is no clear reactivity to external stimuli. There is significant muscle artifact throughout the study.   Photic stimulation: Physiologic driving is not performed  EEG Abnormalities: 1) generalized irregular slow activity  Clinical Interpretation: This EEG is consistent with a generalized nonspecific cerebral dysfunction.  There was no seizure or seizure predisposition recorded on this study. Please note that a normal EEG does not preclude the possibility of epilepsy.   There were persistent episodes of bilateral arm and leg extension throughout the study without EEG correlation(e.g. There is no evidence that these movements are seizure activity).   Ritta SlotMcNeill Faelynn Wynder, MD [facility] [phone]  If 7pm- 7am, please page neurology on call as listed in AMION.

## 2017-10-16 NOTE — Consult Note (Addendum)
PULMONARY / CRITICAL CARE MEDICINE   Name: Alexander Torres MRN: [id number] DOB: [birth year]    ADMISSION DATE:  10/12/2017 CONSULTATION DATE:  10/26/2017  REFERRING MD:  Dr. Angelena Form  CHIEF COMPLAINT:  Cardiac Arrest  HISTORY OF PRESENT ILLNESS:   HPI obtained from medical chart review and from patient's wife in waiting room as patient is intubated and mechanically intubated.    47 year old male with past medical history significant for Diabetes and smoking presented from home 3/5 after cardiac arrest.  Wife reports patient has been complaining of intermittent left arm pain for the last 3 days.  Denied any recent illness, complaints of chest pain or shortness of breath.   Wife reports strong heart disease on patient's father side. The pain woke him up this morning.  Wife talked with him then heard him collapse about 10 mins later and found him on couch unresponsive.  She started CPR per 911 instructions.  He was found in ventricular fibrilliation by EMS, defibrillated x 1 with 4 mins of CPR prior to ROSC.  Unclear total downtime.  He was given narcan without response by the [name] department and again with EMS.  He remained agonal and unresponsive requiring King airway.  Inferior STEMI noted on EKG.  On arrival to ER, Alexander Torres airway exchanged and then taken to the cath lab by Cardiology.  Blood pressure stable and noted to be in Afib with RVR. In cath lab he was found to have severe diffuse coronary disease. LCx was revascularized. During reperfusion he suffered several runs of VT requiring 6 shocks, but no additional CPR.  He remained unresponsive and therefore cooling protocol started.  Labs pending.   PAST MEDICAL HISTORY :  He  has a past medical history of Diabetes mellitus ([name]). Tobacco abuse.  PAST SURGICAL HISTORY: He  has a past surgical history that includes Coronary/Graft Acute MI Revascularization (N/A, 10/15/2017) and LEFT HEART CATH AND CORONARY ANGIOGRAPHY (N/A, 11/09/2017).  Allergies   Allergen Reactions  . Penicillins Hives    No current facility-administered medications on file prior to encounter.    No current outpatient medications on file prior to encounter.    FAMILY HISTORY:  His has no family status information on file.   SOCIAL HISTORY:   REVIEW OF SYSTEMS:   Unable to fully obtain as patient is intubated and sedated.  SUBJECTIVE:    VITAL SIGNS: BP (!) 142/89 Comment: aline reading  Pulse (!) 107   Resp (!) 29   Ht 5' 11.5" (1.816 m)   Wt 110 kg (242 lb 8.1 oz) Comment: estimate  SpO2 93%   BMI 33.35 kg/m   HEMODYNAMICS:    VENTILATOR SETTINGS: Vent Mode: PRVC FiO2 (%):  [100 %] 100 % Set Rate:  [16 bmp-18 bmp] 18 bmp Vt Set:  [500 mL-610 mL] 610 mL PEEP:  [5 cmH20-8 cmH20] 8 cmH20 Plateau Pressure:  [15 cmH20-19 cmH20] 19 cmH20  INTAKE / OUTPUT: No intake/output data recorded.  PHYSICAL EXAMINATION: General:  Middle aged male on vent Neuro:  GCS 3. Rigid with upward gaze deviation. Occasional jerking movements of all extremities.  HEENT:  [place]/AT, Pupils dilated, no JVD Cardiovascular:  Tachy, regular, no MRG Lungs:  Coarse bilateral breath sounds. Vent supported breaths.  Abdomen:  Soft, non-distended Musculoskeletal:  No acute deformity Skin:  Grossly intact  LABS:  BMET Recent Labs  Lab 11/10/2017 0600  NA 137  K 3.9  CL 103  CO2 15*  BUN 7  CREATININE 1.09  GLUCOSE 260*    Electrolytes Recent Labs  Lab 10/15/2017 0600 10/25/2017 0635  CALCIUM 8.7*  --   PHOS  --  5.4*    CBC Recent Labs  Lab 11/10/2017 0635  WBC 30.0*  HGB 14.2  HCT 42.0  PLT 357    Coag's Recent Labs  Lab 11/02/2017 0600  APTT 31  INR 1.09    Sepsis Markers No results for input(s): LATICACIDVEN, PROCALCITON, O2SATVEN in the last 168 hours.  ABG No results for input(s): PHART, PCO2ART, PO2ART in the last 168 hours.  Liver Enzymes Recent Labs  Lab 10/23/2017 0650  AST 129*  ALT 61  ALKPHOS 68  BILITOT 0.7  ALBUMIN 3.6     Cardiac Enzymes Recent Labs  Lab 10/18/2017 0600  TROPONINI 0.39*    Glucose No results for input(s): GLUCAP in the last 168 hours.  Imaging No results found.   STUDIES:  11/02/2017  LHC >>  CULTURES: none  ANTIBIOTICS: none  SIGNIFICANT EVENTS: 3/5 cardiac arrest, to cath lab for LCx stent. TTM 36.  LINES/TUBES: 3/5 ETT >> 3/5 LIJ CVL >>   DISCUSSION: 61 yoM w/hx of chronic pain with 3 day hx of left arm pain from home after cardiac arrest, CPR attempted per wife, found in Vfib defib x 1, ROSC after 4 mins with EMS, required [name] airway, no change with narcan x 2.  Unclear total downtime.  Inferior STEMI on EKG.  Remained unresponsive, cooling initiated and taken to cath lab by Cardiology. He was found to have diffuse coronary disease, but 100% stenosis of LCx and DES was placed.   ASSESSMENT / PLAN:  PULMONARY A: Acute hypoxemic respiratory failure in the setting of Cardiac Arrest  P:   Full MV support, 8 cc/kg PEEP to 8 CXR and ABG now and trend VAP protocol Place ART line PPI for SUP  CARDIOVASCULAR A:  Vfib cardiac arrest Afib with RVR  P:  Tele monitoring Per Cardiology  Goal MAP > 80 with hypothermia Trend troponin and EKG Assess lactate TTM33 with caution as he has had arrythmia. May need to do 36 if unable to maintain.  RENAL A:   AKI P:   Trend BMP / urinary output Replace electrolytes as indicated Avoid nephrotoxic agents, ensure adequate renal perfusion  GASTROINTESTINAL A:   NPO P:   NPO PPI  HEMATOLOGIC A:   No acute issues P:  Trend CBC VTE ppx w/ sq heparin  INFECTIOUS A:   No known acute process P:   Supportive care  ENDOCRINE A:   DM   P:   CBG monitoring and SSI  NEUROLOGIC A:   Acute encephalopathy- in the setting of post cardiac arrest, unknown total downtime, concerning for anoxic injury Hx Chronic neck pain P:   RASS goal: -1 to -2 TTM 36 protocol for neuro protection Sedation with fentanyl  and propofol, will try to wean propofol.  FAMILY  - Updates: Long discussion with family. They understand his prognosis is guarded and all we can really do is offer supportive care for the next few days and hope to gain some insight into neurologic prognosis. They would like to keep him as a full code for now. His wife and parents agree that long term life support is not an option they would consider.   - Inter-disciplinary family meet or Palliative Care meeting due by:  3/12   Georgann Housekeeper, AGACNP-BC Agency Pulmonology/Critical Care Pager [phone] or [phone]  11/08/2017 9:02 AM  ---------------------------------  ^^^^^^^^^^  I have seen and examined the patient. Agree with APP's Assessment and plan. Please see my comments as follows.   47 Y/O MALE SMOKER, DIABETIC, FOUND BY WIFE THIS AM AFTER HE COLLAPSED, EMT RESUSCITATED WITH DEFIB FOR VF, ,ROSC <5 MIN, DOWNTIME UNKNOWN, INITIALLY HAD KING AIRWAY; TAKEN TO CATH LAB AND HAD PCI TO LCX; STARTED HYPOTHERMIA PROTOCOL IN ICU; NOTED VT POST CATH/ SHOCKED; NOTED TO HAVE JERKY MOVTS AND UPWARD EYE GAZING, PARALYZED AND SEDATED AT PRESENT; EEG DONE; CT HEAD DONE, CVC LIJ DONE; R FEM SHEATH IN PLACE; RT COULD NOT GET RADIAL A LINE;  SEEN BY NEUROLOGY, CARDIOLOGY.  FAMILY AWARE OF GUARDED PROGNOSIS.   ON VENT- 40% FI02, PEEP 5, RATE 24, VT 610ML, PIP < 30, NO AUTOPEEP TTM 33-36C. 120/70MMHG, HR 100,  PUPILS EQUAL, SLUGGISH; NO GAG ON ET SUCTIONING; NEURO EXAM NOT COMPLETELY RELIABLE- WHILE PARALYZED AT PRESENT. NO JVD S1 S2 + NO MURMUR AIR EXCHANGE R=L; NO RALES; ABD-SOFT, NON DISTENDED; BS ABSENT NO EDEMA    Results for TRAYSHAWN, DURKIN (MRN [id number]) as of 10/15/2017 11:05  Ref. Range 11/10/2017 10:24  pH, Arterial Latest Ref Range: 7.350 - 7.450  7.272 (L)  pCO2 arterial Latest Ref Range: 32.0 - 48.0 mmHg 39.6  pO2, Arterial Latest Ref Range: 83.0 - 108.0 mmHg 229.0 (H)  TCO2 Latest Ref Range: 22 - 32 mmol/L 20 (L)  Results  for WOLFGANG, FINIGAN (MRN [id number]) as of 11/06/2017 11:05  Ref. Range 10/31/2017 06:50 11/06/2017 09:47  Sodium Latest Ref Range: 135 - 145 mmol/L  138  Potassium Latest Ref Range: 3.5 - 5.1 mmol/L  3.8  Chloride Latest Ref Range: 101 - 111 mmol/L  104  CO2 Latest Ref Range: 22 - 32 mmol/L  18 (L)  Glucose Latest Ref Range: 65 - 99 mg/dL  165 (H)  Mean Plasma Glucose Latest Units: mg/dL 134.11   BUN Latest Ref Range: 6 - 20 mg/dL  8  Creatinine Latest Ref Range: 0.61 - 1.24 mg/dL  0.83  Calcium Latest Ref Range: 8.9 - 10.3 mg/dL  9.0  Anion gap Latest Ref Range: 5 - 15   16 (H)  Magnesium Latest Ref Range: 1.7 - 2.4 mg/dL  1.9  Alkaline Phosphatase Latest Ref Range: 38 - 126 U/L 68   Albumin Latest Ref Range: 3.5 - 5.0 g/dL 3.6   AST Latest Ref Range: 15 - 41 U/L 129 (H)   ALT Latest Ref Range: 17 - 63 U/L 61   Total Protein Latest Ref Range: 6.5 - 8.1 g/dL 6.3 (L)   Bilirubin, Direct Latest Ref Range: 0.1 - 0.5 mg/dL <0.1 (L)   Indirect Bilirubin Latest Ref Range: 0.3 - 0.9 mg/dL NOT CALCULATED   Total Bilirubin Latest Ref Range: 0.3 - 1.2 mg/dL 0.7    MARIN, MILLEY (MRN [id number]) as of 10/29/2017 11:05  Ref. Range 09/06/2009 11:05 11/03/2017 06:00 10/26/2017 06:35 10/22/2017 06:50 10/29/2017 09:47 10/29/2017 10:24  CKMB, poc Latest Ref Range: 1.0 - 8.0 ng/mL <1.0 (L)       Myoglobin, poc Latest Ref Range: 12 - 200 ng/mL 64.1       Troponin I Latest Ref Range: <0.03 ng/mL  0.39 [facility])       Results for EVERETT, EHRLER (MRN [id number]) as of 10/12/2017 11:05  Ref. Range 10/23/2017 06:35  WBC Latest Ref Range: 4.0 - 10.5 K/uL 30.0 (H)  RBC Latest Ref Range: 4.22 - 5.81 MIL/uL 4.62  Hemoglobin Latest Ref Range: 13.0 - 17.0 g/dL 14.2  HCT Latest Ref Range: 39.0 - 52.0 % 42.0  MCV Latest Ref Range: 78.0 - 100.0 fL 90.9  MCH Latest Ref Range: 26.0 - 34.0 pg 30.7  MCHC Latest Ref Range: 30.0 - 36.0 g/dL 33.8  RDW Latest Ref Range: 11.5 - 15.5 % 13.7  Platelets Latest Ref Range: 150 - 400 K/uL 357  Results for  DANDRE, SISLER (MRN [id number]) as of 10/21/2017 11:05  Ref. Range 10/29/2017 09:47  Prothrombin Time Latest Ref Range: 11.4 - 15.2 seconds 15.4 (H)  INR Unknown 1.23  APTT Latest Ref Range: 24 - 36 seconds 48 (H)  Glucose Latest Ref Range: 65 - 99 mg/dL 165 (H)  Results for RIHAN, SCHUELER (MRN [id number]) as of 11/05/2017 11:05  Ref. Range 10/13/2017 06:50 10/22/2017 09:47  Glucose Latest Ref Range: 65 - 99 mg/dL  165 (H)  Hemoglobin A1C Latest Ref Range: 4.8 - 5.6 % 6.3 (H)    Results for PATRICIA, PERALES (MRN [id number]) as of 10/20/2017 11:05  Ref. Range 10/24/2017 06:50  Cholesterol Latest Ref Range: 0 - 200 mg/dL 226 (H)  HDL Cholesterol Latest Ref Range: >40 mg/dL 29 (L)  LDL (calc) Latest Ref Range: 0 - 99 mg/dL 167 (H)  Triglycerides Latest Ref Range: <150 mg/dL 148  VLDL Latest Ref Range: 0 - 40 mg/dL 30     11/08/2017 ekg Atrial fibrillation Inferoposterior infarct, acute (RCA) Probable RV involvement, suggest recording right precordial leads  CXR 3/5 A left IJ central venous line overlies the mid SVC. No pneumothorax is seen. The tip of the endotracheal tube is approximately 6.0 cm above the carina. Somewhat prominent perihilar markings are present which may indicate bronchitis with some peribronchial thickening. Mild volume loss is present at the right lung base. Heart is within normal limits in size. NG tube extends below the hemidiaphragm. There are acute fractures the least the right third and fourth ribs laterally near the axilla. The lower right ribs are not included on chest x-ray.  Coronary/Graft Acute MI Revascularization  LEFT HEART CATH AND CORONARY ANGIOGRAPHY  10/24/2017   Ost Cx lesion is 100% stenosed.  Ost LAD to Prox LAD lesion is 65% stenosed.  Ost 1st Diag lesion is 90% stenosed.  Prox LAD lesion is 65% stenosed.  Ost RCA to Prox RCA lesion is 80% stenosed.  A drug-eluting stent was successfully placed using a STENT SYNERGY DES 3X24.  Post intervention, there  is a 0% residual stenosis.  Mid Cx lesion is 90% stenosed.  Ost 2nd Mrg lesion is 70% stenosed.   1. Acute inferolateral STEMI secondary to proximal occlusion of the large, dominant Circumflex artery.  2. Successful PTCA/DES x 1 proximal Circumflex artery 3. Moderately severe stenosis in the proximal LAD and mid LAD 4. Residual stenosis in the mid AV groove Circumflex artery and ostium of the first OM branch.  5. Severe stenosis in the small, non-dominant RCA Recommendations: He will be admitted to the ICU. PCCM to admit and begin cooling protocol. I will continue IV amiodarone. The IV Cangrelor will be continued for now and will be stopped once his Brilinta loading dose has been given. Rectal ASA will be given now. High dose statin will be started. Echo today to assess LVEF. I will ask the CHF/Shock team to follow.  If he makes a full neurological recovery, will need repeat cardiac to consider PCI of the Circumflex and LAD.      ASSESSMENT-PLAN:   46 Y/O DIABETIC MALE, SMOKER, S/P V FIB ARREST-  ROSC < 5 MIN, POST LCX PCI /DES   MULTIVESSEL CAD  ACUTE RESPIRATORY FAILURE WITH MET/RESP ACIDOSIS   R 3,4 LATERAL RIB FXR SEC TO CPR- NO EVIDENCE OF BAROTRAUMA  AT RISK FOR ANOXIC ENCEPHALOPATHY  STRESS>>LEUKOCYTOSIS  HIGH LDL  AT RISK FOR SHOCK LIVER AND ATN/AKI    CT HEAD EEG- ?CONTINUOUS ANTICONVULSANTS COOLING PROTOCOL NEUROLOGY CONSULT NEURO PROGNOSIS IN THE NEXT FEW DAYS WOULD DETERMINE FURTHER DECISIONS ECHO ANTIPLATELETS, STATIN, BETA BLOCKER, ASA DVT /PUD PROPHYLAXIS FULL VENT SUPPORT; ETT ADVANCED INWARDS; F/U CXR, ABG F/U EKG NPO MONITOR LYTES, RENAL, KIDNEY FUNCTIONS MONITOR SUGARS MONITOR CBC/CHEM  FAMILY COUNSELING CRITICALLY ILL PROGNOSIS GUARDED   Thank you for letting me participate in the care of your patient. ^^^^^^^^^^ I  Have personally spent   60  Minutes  In the care of this Patient providing Critical care Services; Time includes review  of chart, labs, imaging, coordinating care with other physicians and healthcare team members. Also includes time for frequent reevaluation and additional treatment implementation due to change in clinical condiiton of patient. Excludes time spent for Procedure and Teaching.  ^^^^^^^^^^ Note subject to typographical and grammatical errors;   Any formal questions or concerns about the content, text, or information contained within the body of this dictation should be directly addressed to the physician  for  clarification.  Evans Lance, MD Pulmonary and [place] Pager: [phone]     ----12.50pm CXR -ETT 3 cm above carina; CT brain- normal report. SPS

## 2017-10-16 NOTE — Progress Notes (Signed)
Patient was transported to CT and back without any complications. Patient is resting on ordered vent settings per protocol

## 2017-10-16 NOTE — Progress Notes (Addendum)
Initial Nutrition Assessment  DOCUMENTATION CODES:   Obesity unspecified  INTERVENTION:   Recommend initiation of TF within 24-48 hours.   Recommend Vital HP at 3720mL/hr x24hrs with prostat 60mL QID; this provides 1280kcal, 162g protein and 403mL free water.  Propofol at 13.602mL/hr at time of visit; this provides 348kcal/day.  Vital HP plus propofol provides 1628kcal (106% of needs), 162g protein (100% of needs), and 403mL free water.   NUTRITION DIAGNOSIS:   Inadequate oral intake related to inability to eat as evidenced by NPO status.  GOAL:   Provide needs based on ASPEN/SCCM guidelines  MONITOR:   Vent status, I & O's, TF tolerance, Labs, Weight trends  REASON FOR ASSESSMENT:   Ventilator    ASSESSMENT:   47 year old male with PMH of DM and smoking. Presented from home 3/5 after cardiac arrest.  3/5 EKG showed STEMI; stent placed in Cath Lab.  [place] Protocol, Normothermia (36C) initiated 3/5 at [phone]. Not on paralytics or requiring pressors.  Pt is appropriate for initiation of TF.  Pt has OG 18 french tube in place.  Pt intubated on ventilator support; Minute ventilation 14.1L/min.  Temperature used to calculate needs 37C.   Pt is unable to contribute any diet or wt hx; no family at bedside.   Propofol at 13.762mL/hr at time of visit; this provides 348kcal/day.  Medications: Colace, famotidine, insulin, zofran, senokot, Mg once time IV dose, IV KCl every 4hr, propofol NS IVF   Labs: Phos (H; 5.4)  Lab Results  Component Value Date   HGBA1C 6.3 (H) September 28, 2017    CBG (last 3)  Recent Labs    2018/01/22 1010 2018/01/22 1302  GLUCAP 157* 135*   NUTRITION - FOCUSED PHYSICAL EXAM:  No muscle/fat depletions or micronutrient deficiencies noted at this time.   Diet Order:  Diet NPO time specified  EDUCATION NEEDS:   Not appropriate for education at this time  Skin:  Skin Assessment: Reviewed RN Assessment  Last BM:  PTA  Height:   Ht  Readings from Last 1 Encounters:  2018/01/22 5' 11.5" (1.816 m)    Weight:   Wt Readings from Last 1 Encounters:  2018/01/22 242 lb 8.1 oz (110 kg)    Ideal Body Weight:  79.5 kg  BMI:  Body mass index is 33.35 kg/m.  Estimated Nutritional Needs:   Kcal:  1210-1540  Protein:  >/=159g  Fluid:  2.8L  Aydyn Testerman, MS, Dietetic Intern Pager # [phone]

## 2017-10-16 NOTE — Progress Notes (Signed)
  Echocardiogram 2D Echocardiogram with definity has been performed.  Leta JunglingCooper, Kosisochukwu Goldberg M 11/08/2017, 12:42 PM

## 2017-10-16 NOTE — ED Provider Notes (Signed)
[facility] EMERGENCY DEPARTMENT Provider Note   CSN: [id number] Arrival date & time: 2017-11-04  0553     History   Chief Complaint Chief Complaint  Patient presents with  . Cardiac Arrest    HPI CHARLENE COWDREY is a 47 y.o. male.  Patient is a 47 year old male with undetermined past medical history.  He was brought by EMS after his wife found him unresponsive.  The history was relayed to me through the EMS personnel.  From what I am told, his wife spoke to them 10 minutes before finding him not responding.  She initially did CPR, then called 911.  He was given Narcan and CPR was initiated by police and fire.  Paramedics arrived to find him in V. fib, then was shocked to a reported sinus arrhythmia.  Initial EKG showed what appeared to be an infero-posterior wall MI.  He arrived here as a code STEMI unresponsive.  A King airway had been placed prior to arrival and was being ventilated with the Ambu bag.      No past medical history on file.  There are no active problems to display for this patient.        Home Medications    Prior to Admission medications   Not on File    Family History No family history on file.  Social History Social History   Tobacco Use  . Smoking status: Not on file  Substance Use Topics  . Alcohol use: Not on file  . Drug use: Not on file     Allergies   Patient has no allergy information on record.   Review of Systems Review of Systems  Unable to perform ROS: Acuity of condition     Physical Exam Updated Vital Signs BP (!) 168/40   Pulse (!) 144   Resp 18   SpO2 98%   Physical Exam  Constitutional: He appears well-developed and well-nourished. No distress.  HENT:  Head: Normocephalic and atraumatic.  Mouth/Throat: Oropharynx is clear and moist.  Neck: Normal range of motion. Neck supple.  Cardiovascular: Normal rate and regular rhythm. Exam reveals no friction rub.  No murmur heard. Pulmonary/Chest:  Effort normal and breath sounds normal. No respiratory distress. He has no wheezes. He has no rales.  He is attempting to take respiration spontaneously and is bucking the ambu bag.  Abdominal: Soft. Bowel sounds are normal. He exhibits no distension. There is no tenderness.  Musculoskeletal: Normal range of motion. He exhibits no edema.  Neurological:  GCS is 3.  He has not moved any extremities or opened his eyes spontaneously.  GCS initially 3.  Skin: Skin is warm and dry. He is not diaphoretic.  Nursing note and vitals reviewed.    ED Treatments / Results  Labs (all labs ordered are listed, but only abnormal results are displayed) Labs Reviewed - No data to display  EKG  EKG Interpretation None       Radiology No results found.  Procedures Procedures (including critical care time)  Medications Ordered in ED Medications  etomidate (AMIDATE) injection (20 mg Intravenous Given 11/04/17 0556)     Initial Impression / Assessment and Plan / ED Course  I have reviewed the triage vital signs and the nursing notes.  Pertinent labs & imaging results that were available during my care of the patient were reviewed by me and considered in my medical decision making (see chart for details).  Patient arrived here post arrest after receiving CPR and  cardioversion.  EKG is consistent with a inferior posterior wall MI.  A code STEMI had been called prior to arrival and the cath team was awaiting his arrival.    Patient was taken into resuscitation room A.  [facility] and a 7.5 endotracheal tube was placed using the glide scope.  RSI was performed using 20 mg of etomidate and 200 mg of succinylcholine.  Tube placement was confirmed with end-tidal CO2 and auscultation over the chest and stomach.  He will go to the cath lab.  Care transferred to Dr. Sanjuana KavaMcAlhaney.  CRITICAL CARE Performed by: Geoffery Lyonsouglas Vincenza Dail Total critical care time: 35 minutes Critical care time was exclusive  of separately billable procedures and treating other patients. Critical care was necessary to treat or prevent imminent or life-threatening deterioration. Critical care was time spent personally by me on the following activities: development of treatment plan with patient and/or surrogate as well as nursing, discussions with consultants, evaluation of patient's response to treatment, examination of patient, obtaining history from patient or surrogate, ordering and performing treatments and interventions, ordering and review of laboratory studies, ordering and review of radiographic studies, pulse oximetry and re-evaluation of patient's condition.   Final Clinical Impressions(s) / ED Diagnoses   Final diagnoses:  None    ED Discharge Orders    None       Geoffery Lyonselo, Makensey Rego, MD 10/30/2017 [phone]

## 2017-10-16 NOTE — Consult Note (Signed)
  Cardiology Admission History and Physical:   Patient ID: Alexander PinchBrian K Groom; MRN: [id number]; DOB: 24-Nov-1970   Admission date: 10/22/2017  Primary Care Provider: No primary care provider on file. Primary Cardiologist: No primary care provider on file.  Primary Electrophysiologist:    Chief Complaint:  Post cardiac arrest  Patient Profile:   Alexander Torres is a 47 y.o. male with a history of DM and tobacco abuse who is presenting as a post cardiac arrest/post CPR patient.   History of Present Illness:   Alexander Torres has a history of DM. We are unable to obtain further past medical history. His wife called EMS this am after she heard him collapse. It is unclear how long he was down. EMS found him to be unresponsive, pulseless and reported ventricular fibrillation. He was shocked once and CPR was started. Pulses were regained. He arrives in the ED in rapid atrial fibrillation, unresponsive. He was intubated in the ED. He has been started on a propofol drip. EKG with inferior ST elevation.    Past Medical History:  Diagnosis Date  . Diabetes mellitus [facility](HCC)    Past Surgical History/Family History/Social History/Medications/Allergies: Unable to obtain. Patient unresponsive. No family Present.   ROS: Unable to obtain. Pt unresponsive. No family present.     Physical Exam/Data:   Vitals:   10/15/2017 0600 11/02/2017 0601 11/09/2017 0602  BP: (!) 147/118 (!) 168/40   Pulse: 76 (!) 144   Resp: (!) 26 18   SpO2: (!) 82% 98%   Weight:   242 lb 8.1 oz (110 kg)   No intake or output data in the 24 hours ending 10/29/2017 0619 Filed Weights   10/18/2017 0602  Weight: 242 lb 8.1 oz (110 kg)   There is no height or weight on file to calculate BMI.  General:  Unresponsive, intubated.  HEENT: normal Lymph: no adenopathy Neck: no JVD Endocrine:  No thryomegaly Vascular: No carotid bruits; FA pulses 2+ bilaterally without bruits  Cardiac:  Tachy, irregular.  Lungs:  clear to auscultation bilaterally, no  wheezing, rhonchi or rales  Abd: soft, nontender, no hepatomegaly  Ext: no LE edema Musculoskeletal:  No deformities, BUE and BLE strength normal and equal Skin: warm and dry  Neuro:  CNs 2-12 intact, no focal abnormalities noted Psych:  unresponsive   EKG:  The ECG that was done tonight shows atrial fib with RVR, 3 mm inferior ST elevation, diffuse ST depression V1-V3.   Relevant CV Studies:   Laboratory Data:  ChemistryNo results for input(s): NA, K, CL, CO2, GLUCOSE, BUN, CREATININE, CALCIUM, GFRNONAA, GFRAA, ANIONGAP in the last 168 hours.  No results for input(s): PROT, ALBUMIN, AST, ALT, ALKPHOS, BILITOT in the last 168 hours. HematologyNo results for input(s): WBC, RBC, HGB, HCT, MCV, MCH, MCHC, RDW, PLT in the last 168 hours. Cardiac EnzymesNo results for input(s): TROPONINI in the last 168 hours. No results for input(s): TROPIPOC in the last 168 hours.  BNPNo results for input(s): BNP, PROBNP in the last 168 hours.  DDimer No results for input(s): DDIMER in the last 168 hours.  Radiology/Studies:  No results found.  Assessment and Plan:   1. Cardiac arrest/Inferior STEMI: emergent cardiac cath. PCCM to admit post cath for cooling protocol.    Signed, Verne Carrowhristopher Diksha Tagliaferro, MD  11/10/2017 6:19 AM

## 2017-10-16 NOTE — ED Notes (Signed)
Patient transported to cath lab with Enis GashJessica F, RN and Dr. Clifton JamesMcAlhany.

## 2017-10-16 NOTE — Progress Notes (Signed)
Right arterial sheath removed at 1600 per protocol. Manual pressure applied for ~7220min. VSS throughout procedure. Distal pulses present. Site level zero.

## 2017-10-16 NOTE — Progress Notes (Signed)
Patient's ABG results were RBAV  By Joneen RoachPaul Hoffman, NP. Pt's RR was increased to 24 and FIO2 was decreased to 40%.

## 2017-10-16 NOTE — Consult Note (Signed)
Neurology Consultation Reason for Consult: Possible anoxic injury Referring Physician: Joneen RoachHoffman, Paul  CC: Abnormal movements  History is obtained from: Chart review  HPI: Alexander Torres is a 47 y.o. male who was found down by family about 10 minutes after speaking with him this morning.  He received bystander CPR and then she called 911.  He was given Narcan and CPR was started by police and fire, paramedics found him in V. fib and that he  Converted to sinus rhythm after cardioversion.   EKG showed STEMI and he was taken to the Cath Lab emergently where a stent was placed.  He began having abnormal movements concerning for seizure and therefore neurology was consulted.   ROS:  Unable to obtain due to altered mental status.   Past Medical History:  Diagnosis Date  . Diabetes mellitus (HCC)      Family history: Unable to obtain due to altered mental status   Social History: Unable to obtain due to altered mental status   Exam: Current vital signs: BP (!) 89/67   Pulse 100   Temp 97.9 F (36.6 C) (Bladder)   Resp (!) 24   Ht 5' 11.5" (1.816 m)   Wt 110 kg (242 lb 8.1 oz) Comment: estimate  SpO2 100%   BMI 33.35 kg/m  Vital signs in last 24 hours: Temp:  [94.5 F (34.7 C)-98.8 F (37.1 C)] 97.9 F (36.6 C) (03/05 1125) Pulse Rate:  [0-149] 100 (03/05 1030) Resp:  [0-54] 24 (03/05 1200) BP: (0-168)/(0-120) 89/67 (03/05 1200) SpO2:  [0 %-100 %] 100 % (03/05 1030) Arterial Line BP: (104-147)/(61-89) 108/73 (03/05 1200) FiO2 (%):  [40 %-100 %] 40 % (03/05 1128) Weight:  [110 kg (242 lb 8.1 oz)] 110 kg (242 lb 8.1 oz) (03/05 0602)   Physical Exam  Constitutional: Appears well-developed and well-nourished.  Psych: Unresponsive  eyes: No scleral injection HENT: ET tube in place Head: Normocephalic.  Cardiovascular: Normal rate and regular rhythm.  Respiratory: Ventilated GI: Soft.  No distension. There is no tenderness.  Skin: WDI  Neuro: Mental  Status: Comatose, does not open eyes or follow commands Cranial Nerves: II: Does not blink to threat. Pupils are equal, round, and reactive to light.   III,IV, VI: Doll's eye testing limited due to lines in place V: VII: Corneals are intact X: Cough intact Motor: Flexion versus withdrawal to noxious stimuli in all 4 extremities.  In addition, he has intermittent bilateral extension with inward rotation of all 4 extremities Sensory: As above deep Tendon Reflexes: 2+ and symmetric in the brachoradialis and patellae.  Cerebellar: Does not perform  I have reviewed labs in epic and the results pertinent to this consultation are: CO2 39.6 BMP-elevated glucose, otherwise unremarkable  I have reviewed the images obtained: CT head-unremarkable  Impression: 47 year old male with cardiac arrest and now abnormal movements.  The EEG does not support an ictal nature to the current movements that are observed and I suspect they may be posturing.  At this time, I do not feel that we have conclusive evidence of poor prognosis and would recommend aggressive therapy from a neurological standpoint, especially considering his young age.  I would favor using EEG to guide further antiepileptic therapy, especially as he is likely to need vecuronium.  Recommendations: 1) overnight EEG 2) CT had recommended(available for review at the time of finalizing this note, see above) 3) neurology will continue to follow   Ritta SlotMcNeill Antoniette Peake, MD [facility] [phone]  If 7pm- 7am, please  page neurology on call as listed in AMION.

## 2017-10-16 NOTE — Progress Notes (Signed)
RT note: RT's  Morrie Sheldonshley an Fayrene FearingJames attempted to place radial aline on left and right radials. RT's could enter vessel but were unable to advance catheter on either side. RN and NP aware.

## 2017-10-16 NOTE — ED Triage Notes (Signed)
Patient was found by wife unresponsive[facility] did one round of CPR, gave .5 mg of Narcan and regained ROSC. En route, patient began to deteriorate again and had to be intubated.;

## 2017-10-16 NOTE — Progress Notes (Signed)
STAT EEG completed; results pending. Dr Amada JupiterKirkpatrick aware

## 2017-10-16 NOTE — Progress Notes (Signed)
vLTM EEG running. No skin breakdown. Notified Neuro 

## 2017-10-16 NOTE — Progress Notes (Signed)
CRITICAL VALUE ALERT  Critical Value: Trop 22.17  Date & Time Notied: 11/01/2017 1000   Provider Notified: Dr. Clifton JamesMcAlhany  Orders Received/Actions taken: Expected value post PCI and VF arrest

## 2017-10-16 NOTE — Procedures (Addendum)
Central Venous Catheter Insertion Procedure Note Jeral PinchBrian K Alarid [id number] Jan 06, 1971  Procedure: Insertion of Central Venous Catheter Indications: Assessment of intravascular volume, Drug and/or fluid administration and Frequent blood sampling  Procedure Details Consent: Unable to obtain consent because of emergent medical necessity. Time Out: Verified patient identification, verified procedure, site/side was marked, verified correct patient position, special equipment/implants available, medications/allergies/relevent history reviewed, required imaging and test results available.  Performed  Maximum sterile technique was used including antiseptics, cap, gloves, gown, hand hygiene, mask and sheet. Skin prep: Chlorhexidine; local anesthetic administered A antimicrobial bonded/coated triple lumen catheter was placed in the left internal jugular vein using the Seldinger technique. Ultrasound guidance used.Yes.   Catheter placed to 20 cm. Blood aspirated via all 3 ports and then flushed x 3. Line sutured x 2 and dressing applied.  Evaluation Blood flow good Complications: No apparent complications Patient did tolerate procedure well. Chest X-ray ordered to verify placement.     Joneen RoachPaul Hoffman, AGACNP-BC [facility] Pager [phone] or [phone]  10/15/2017 10:45 AM   cxr- post procedure. Tip of CVC in SVC; no PTX.  Caryl ComesSiva P Sivakumar, MD [facility]

## 2017-10-16 NOTE — ED Notes (Signed)
Ice packs applied for [employer]

## 2017-10-17 ENCOUNTER — Inpatient Hospital Stay (HOSPITAL_COMMUNITY): Payer: BC Managed Care – PPO

## 2017-10-17 DIAGNOSIS — G931 Anoxic brain damage, not elsewhere classified: Secondary | ICD-10-CM

## 2017-10-17 DIAGNOSIS — I213 ST elevation (STEMI) myocardial infarction of unspecified site: Secondary | ICD-10-CM

## 2017-10-17 DIAGNOSIS — I4901 Ventricular fibrillation: Secondary | ICD-10-CM

## 2017-10-17 DIAGNOSIS — J9600 Acute respiratory failure, unspecified whether with hypoxia or hypercapnia: Secondary | ICD-10-CM

## 2017-10-17 LAB — POCT I-STAT 3, ART BLOOD GAS (G3+)
ACID-BASE DEFICIT: 3 mmol/L — AB (ref 0.0–2.0)
ACID-BASE DEFICIT: 3 mmol/L — AB (ref 0.0–2.0)
BICARBONATE: 22.4 mmol/L (ref 20.0–28.0)
Bicarbonate: 19.2 mmol/L — ABNORMAL LOW (ref 20.0–28.0)
O2 Saturation: 99 %
O2 Saturation: 98 %
PH ART: 7.479 — AB (ref 7.350–7.450)
Patient temperature: 97.9
TCO2: 20 mmol/L — ABNORMAL LOW (ref 22–32)
TCO2: 24 mmol/L (ref 22–32)
pCO2 arterial: 25.7 mmHg — ABNORMAL LOW (ref 32.0–48.0)
pCO2 arterial: 42 mmHg (ref 32.0–48.0)
pH, Arterial: 7.334 — ABNORMAL LOW (ref 7.350–7.450)
pO2, Arterial: 147 mmHg — ABNORMAL HIGH (ref 83.0–108.0)
pO2, Arterial: 108 mmHg (ref 83.0–108.0)

## 2017-10-17 LAB — BASIC METABOLIC PANEL
ANION GAP: 13 (ref 5–15)
ANION GAP: 9 (ref 5–15)
BUN: 6 mg/dL (ref 6–20)
BUN: 5 mg/dL — ABNORMAL LOW (ref 6–20)
CALCIUM: 8.7 mg/dL — AB (ref 8.9–10.3)
CHLORIDE: 105 mmol/L (ref 101–111)
CHLORIDE: 106 mmol/L (ref 101–111)
CO2: 17 mmol/L — ABNORMAL LOW (ref 22–32)
CO2: 20 mmol/L — AB (ref 22–32)
CREATININE: 0.49 mg/dL — AB (ref 0.61–1.24)
Calcium: 8.7 mg/dL — ABNORMAL LOW (ref 8.9–10.3)
Creatinine, Ser: 0.63 mg/dL (ref 0.61–1.24)
GFR calc Af Amer: >60 mL/min (ref >60)
GFR calc non Af Amer: >60 mL/min (ref >60)
GLUCOSE: 122 mg/dL — AB (ref 65–99)
Glucose, Bld: 161 mg/dL — ABNORMAL HIGH (ref 65–99)
POTASSIUM: 4.7 mmol/L (ref 3.5–5.1)
Potassium: 3.8 mmol/L (ref 3.5–5.1)
Sodium: 135 mmol/L (ref 135–145)
Sodium: 135 mmol/L (ref 135–145)

## 2017-10-17 LAB — CBC
HCT: 35.1 % — ABNORMAL LOW (ref 39.0–52.0)
Hemoglobin: 12 g/dL — ABNORMAL LOW (ref 13.0–17.0)
MCH: 30.1 pg (ref 26.0–34.0)
MCHC: 34.2 g/dL (ref 30.0–36.0)
MCV: 88 fL (ref 78.0–100.0)
PLATELETS: 245 10*3/uL (ref 150–400)
RBC: 3.99 MIL/uL — ABNORMAL LOW (ref 4.22–5.81)
RDW: 13.6 % (ref 11.5–15.5)
WBC: 9.7 10*3/uL (ref 4.0–10.5)

## 2017-10-17 LAB — HEPATIC FUNCTION PANEL
ALT: 69 U/L — ABNORMAL HIGH (ref 17–63)
AST: 154 U/L — ABNORMAL HIGH (ref 15–41)
Albumin: 3.1 g/dL — ABNORMAL LOW (ref 3.5–5.0)
Alkaline Phosphatase: 48 U/L (ref 38–126)
BILIRUBIN DIRECT: 0.1 mg/dL (ref 0.1–0.5)
BILIRUBIN INDIRECT: 0.6 mg/dL (ref 0.3–0.9)
BILIRUBIN TOTAL: 0.7 mg/dL (ref 0.3–1.2)
Total Protein: 5.6 g/dL — ABNORMAL LOW (ref 6.5–8.1)

## 2017-10-17 LAB — MAGNESIUM: MAGNESIUM: 1.9 mg/dL (ref 1.7–2.4)

## 2017-10-17 LAB — GLUCOSE, CAPILLARY
GLUCOSE-CAPILLARY: 140 mg/dL — AB (ref 65–99)
GLUCOSE-CAPILLARY: 160 mg/dL — AB (ref 65–99)
GLUCOSE-CAPILLARY: 81 mg/dL (ref 65–99)
Glucose-Capillary: 180 mg/dL — ABNORMAL HIGH (ref 65–99)
Glucose-Capillary: 118 mg/dL — ABNORMAL HIGH (ref 65–99)
Glucose-Capillary: 99 mg/dL (ref 65–99)

## 2017-10-17 LAB — TROPONIN I: TROPONIN I: 29.05 ng/mL — AB (ref <0.03)

## 2017-10-17 LAB — PHOSPHORUS: PHOSPHORUS: 3.1 mg/dL (ref 2.5–4.6)

## 2017-10-17 LAB — APTT: APTT: 34 s (ref 24–36)

## 2017-10-17 MED ORDER — LEVETIRACETAM IN NACL 1500 MG/100ML IV SOLN
1500.0000 mg | Freq: Two times a day (BID) | INTRAVENOUS | Status: DC
  Administered 2017-10-18 – 2017-10-19 (×3): 1500 mg via INTRAVENOUS
  Filled 2017-10-17 (×4): qty 100

## 2017-10-17 MED ORDER — LORAZEPAM 2 MG/ML IJ SOLN
4.0000 mg | Freq: Once | INTRAMUSCULAR | Status: AC
  Administered 2017-10-17: 4 mg via INTRAVENOUS
  Filled 2017-10-17: qty 2

## 2017-10-17 MED ORDER — LORAZEPAM 2 MG/ML IJ SOLN
2.0000 mg | Freq: Once | INTRAMUSCULAR | Status: AC
  Administered 2017-10-17: 2 mg via INTRAVENOUS
  Filled 2017-10-17: qty 1

## 2017-10-17 MED ORDER — VALPROATE SODIUM 500 MG/5ML IV SOLN
500.0000 mg | Freq: Three times a day (TID) | INTRAVENOUS | Status: DC
  Administered 2017-10-17 – 2017-10-18 (×2): 500 mg via INTRAVENOUS
  Filled 2017-10-17 (×3): qty 5

## 2017-10-17 MED ORDER — LEVETIRACETAM IN NACL 1000 MG/100ML IV SOLN
1000.0000 mg | Freq: Once | INTRAVENOUS | Status: AC
  Administered 2017-10-17: 1000 mg via INTRAVENOUS
  Filled 2017-10-17: qty 100

## 2017-10-17 MED ORDER — VALPROATE SODIUM 500 MG/5ML IV SOLN
500.0000 mg | Freq: Once | INTRAVENOUS | Status: AC
  Administered 2017-10-17: 500 mg via INTRAVENOUS
  Filled 2017-10-17: qty 5

## 2017-10-17 MED ORDER — VALPROATE SODIUM 500 MG/5ML IV SOLN
1500.0000 mg | Freq: Once | INTRAVENOUS | Status: AC
  Administered 2017-10-17: 1500 mg via INTRAVENOUS
  Filled 2017-10-17: qty 15

## 2017-10-17 NOTE — Progress Notes (Signed)
RT note- ventilator changes, ABG in 2 hours

## 2017-10-17 NOTE — Progress Notes (Addendum)
Subjective: EEG with some sharp activity. Started on keppra yesterday afternoon. CAlmed down earlier today, but now periodic right sided discharges.   Exam: Vitals:   10/17/17 1000 10/17/17 1100  BP: (!) 87/64 92/72  Pulse:    Resp: 20 11  Temp: 98.4 F (36.9 C) 99.7 F (37.6 C)  SpO2:     Gen: In bed, NAD Resp: non-labored breathing, no acute distress Abd: soft, nt  Neuro: MS: does not open eyes or follow commands.  ZO:XWRUCN:eyes dysconjugate, pupils reactive bilaterally,  Motor: flexion vs withdrawal x 4. He has increased tone in the right arm and leg.  Sensory:as above.  DTR: Clonus at the knees bilaterally.   Pertinent Labs: Cr 0.49  I have spot-reviewed the EEG- Frequent right discharges, maximal at C4, P4, F4.   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 He appears to be having intermittent epileptiform activity on the EEG, which improved with ativan.   At this time, I do not have any clear signs to state a clear prognosis, would continue aggresive care until a more clear recommendation could be made.   Recommendations: 1) Continue keppra 1g BID 2) Start depakote 2g load, then 500mg  TID 3) depakote level in the am.  4) will continue to follow.   This patient is critically ill and at significant risk of neurological worsening, death and care requires constant monitoring of vital signs, hemodynamics,respiratory and cardiac monitoring, neurological assessment, discussion with family, other specialists and medical decision making of high complexity. I spent 40 minutes of neurocritical care time  in the care of  this patient.  Ritta SlotMcNeill Destenee Guerry, MD [facility] [phone]  If 7pm- 7am, please page neurology on call as listed in AMION. 10/17/2017  1:02 PM

## 2017-10-17 NOTE — Procedures (Signed)
  Electroencephalogram report- LTM    Data acquisition: 10-20 electrode placement.  Additional T1, T2, and EKG electrodes; 26 channel digital referential acquisition reformatted to 18 channel/7 channel coronal bipolar     Spike detection: ON     Seizure detection: ON   Beginning time: 10/18/2017 at 15 23  Ending time:10/17/17 at 07 44 am   CPT: 95951 Day of study: day 1    This 16 hours of intensive EEG monitoring with simultaneous video monitoring was performed for this patient s/p cardiac arest to rule out seizures.  Medications: as per EMR Intubated   There was no pushbutton activations events during this recording.  Automated spike detection program did not detect any spikes. Seizure detection program did not detect any seizures .   The background marked by attenuated delta slowing at the beginning  of the recording gradualy become faster,  reaching 7 cps  with state changes present as well.   Clinical interpretation: This 16 hours of intensive EEG monitoring with simultaneous monitoring did not record any clinical or subclinical seizures.  Background activities  c/w encephalopathy with gradual improvement throughout the recording as discussed above.

## 2017-10-17 NOTE — Progress Notes (Signed)
LTM maintenanced. All electrodes still under 5 kohms, no skin breakdown was seen.

## 2017-10-17 NOTE — Progress Notes (Signed)
CRITICAL CARE PROGRESS NOTE   ICU day #     2 Hospital day #2 Mechanical Ventilation day # 2  Patient Summary:    Name: Alexander Torres MRN:   [id number] DOB:   Aug 02, 1971          ADMISSION DATE:  11/11/2017 CONSULTATION DATE:  10/27/2017 REFERRING MD:  Dr. Angelena Form CHIEF COMPLAINT:  Cardiac Arrest HISTORY OF PRESENT ILLNESS:   HPI obtained from medical chart review and from patient's wife in waiting room as patient is intubated and mechanically intubated.   47 year old male with past medical history significant for Diabetes and smoking presented from home 3/5 after cardiac arrest. Wife reports patient has been complaining of intermittent left arm pain for the last 3 days.  Denied any recent illness, complaints of chest pain or shortness of breath.   Wife reports strong heart disease on patient's father side. The pain woke him up this morning.  Wife talked with him then heard him collapse about 10 mins later and found him on couch unresponsive.  She started CPR per 911 instructions.  He was found in ventricular fibrilliation by EMS, defibrillated x 1 with 4 mins of CPR prior to ROSC.  Unclear total downtime.  He was given narcan without response by the [facility] department and again with EMS.  He remained agonal and unresponsive requiring King airway.  Inferior STEMI noted on EKG.  On arrival to ER, [name] airway exchanged and then taken to the cath lab by Cardiology.  Blood pressure stable and noted to be in Afib with RVR. In cath lab he was found to have severe diffuse coronary disease. LCx was revascularized. During reperfusion he suffered several runs of VT requiring 6 shocks, but no additional CPR.  He remained unresponsive and therefore cooling protocol started.    PAST MEDICAL HISTORY :  He  has a past medical history of Diabetes mellitus ([place]). Tobacco abuse.  PAST SURGICAL HISTORY: He  has a past surgical history that includes Coronary/Graft Acute MI Revascularization (N/A,  11/05/2017) and LEFT HEART CATH AND CORONARY ANGIOGRAPHY (N/A, 10/23/2017).      Allergies  Allergen Reactions  . Penicillins Hives   . aspirin  81 mg Oral Daily  . atorvastatin  80 mg Oral q1800  . chlorhexidine gluconate (MEDLINE KIT)  15 mL Mouth Rinse BID  . Chlorhexidine Gluconate Cloth  6 each Topical Daily  . docusate  100 mg Per Tube BID  . famotidine  20 mg Per Tube BID  . fentaNYL (SUBLIMAZE) injection  100 mcg Intravenous Once  . heparin injection (subcutaneous)  5,000 Units Subcutaneous Q8H  . insulin aspart  1-3 Units Subcutaneous Q4H  . mouth rinse  15 mL Mouth Rinse 10 times per day  . sennosides  5 mL Per Tube QHS  . sodium chloride flush  10-40 mL Intracatheter Q12H  . ticagrelor  90 mg Oral BID    New Events  3/6 On hypothermia protocol>TTM - 35C No szr on EEG (CONT) No myoclonus No secretions Good urine outpt No further arrythmias- on IV amiodarone     PHYSICAL EXAMINATION: Blood pressure 118/85, pulse 79, temperature (!) 97.5 F (36.4 C), temperature source Core, resp. rate 13, height 5' 11.5" (1.816 m), weight 205 lb 14.6 oz (93.4 kg), SpO2 100 %.  General:  Middle aged male on vent HEENT:  [place]/AT, Pupils equal, reacting no JVD Cardiovascular:  Tachy, regular, no MRG, s1 s2 - no murmur Lungs:  Coarse bilateral breath sounds.  Vent supported breaths.  Abdomen:  Soft, non-distended, BS+ Musculoskeletal:  No acute deformity No edema Tone- normal Skin:  Grossly intact  ON VENT- 40% FI02, PEEP 5, RATE 24, VT 610ML, PIP < 30, NO AUTOPEEP    CT brain 3/6   1. No acute intracranial abnormalities.  Normal brain.  11/10/2017 ekg Atrial fibrillation Inferoposterior infarct, acute (RCA) Probable RV involvement, suggest recording right precordial leads 3/6 Normal sinus rhythm Prolonged QT Posterior MI (acute or recent) QTC 479 msec  3/6 cxr Endotracheal tube tip at the clavicular heads. Left IJ central line tip is at the upper SVC. An orogastric  tube reaches the stomach.  Improved aeration at the left base with better defined diaphragm. There is streaky left perihilar opacity favoring atelectasis. No Kerley lines, effusion, or pneumothorax. Lateral right third and fourth rib fractures, presumably from CPR.  CXR 3/5 A left IJ central venous line overlies the mid SVC. No pneumothorax is seen. The tip of the endotracheal tube is approximately 6.0 cm above the carina. Somewhat prominent perihilar markings are present which may indicate bronchitis with some peribronchial thickening. Mild volume loss is present at the right lung base. Heart is within normal limits in size. NG tube extends below the hemidiaphragm. There are acute fractures the least the right third and fourth ribs laterally near the axilla. The lower right ribs are not included on chest x-ray.    3/6 TTE - Left ventricle: The cavity size was normal. Wall thickness was   normal. Systolic function was moderately reduced. The estimated   ejection fraction was in the range of 35% to 40%. Severe   hypokinesis of the entireinferolateral myocardium; consistent   with ischemia or infarction in the distribution of the left   circumflex coronary artery. The study is not technically   sufficient to allow evaluation of LV diastolic function. Acoustic   contrast opacification revealed no evidence ofthrombus. - Mitral valve: Calcified annulus. There was mild regurgitation. - Left atrium: The atrium was mildly dilated. - Right ventricle: Systolic function was mildly reduced.       Coronary/Graft Acute MI Revascularization  LEFT HEART CATH AND CORONARY ANGIOGRAPHY  10/30/2017   Ost Cx lesion is 100% stenosed.  Ost LAD to Prox LAD lesion is 65% stenosed.  Ost 1st Diag lesion is 90% stenosed.  Prox LAD lesion is 65% stenosed.  Ost RCA to Prox RCA lesion is 80% stenosed.  A drug-eluting stent was successfully placed using a STENT SYNERGY DES 3X24.  Post  intervention, there is a 0% residual stenosis.  Mid Cx lesion is 90% stenosed.  Ost 2nd Mrg lesion is 70% stenosed.  1. Acute inferolateral STEMI secondary to proximal occlusion of the large, dominant Circumflex artery.  2. Successful PTCA/DES x 1 proximal Circumflex artery 3. Moderately severe stenosis in the proximal LAD and mid LAD 4. Residual stenosis in the mid AV groove Circumflex artery and ostium of the first OM branch.  5. Severe stenosis in the small, non-dominant RCA Recommendations: He will be admitted to the ICU. PCCM to admit and begin cooling protocol. I will continue IV amiodarone. The IV Cangrelor will be continued for now and will be stopped once his Brilinta loading dose has been given. Rectal ASA will be given now. High dose statin will be started. Echo today to assess LVEF. I will ask the CHF/Shock team to follow.  If he makes a full neurological recovery, will need repeat cardiac to consider PCI of the Circumflex and LAD.  Results for HURBERT, DURAN (MRN [id number]) as of 10/17/2017 09:45  Ref. Range 11/07/2017 21:12  Sodium Latest Ref Range: 135 - 145 mmol/L 135  Potassium Latest Ref Range: 3.5 - 5.1 mmol/L 4.7  Chloride Latest Ref Range: 101 - 111 mmol/L 105  CO2 Latest Ref Range: 22 - 32 mmol/L 17 (L)  Glucose Latest Ref Range: 65 - 99 mg/dL 161 (H)  BUN Latest Ref Range: 6 - 20 mg/dL 6  Creatinine Latest Ref Range: 0.61 - 1.24 mg/dL 0.63  Calcium Latest Ref Range: 8.9 - 10.3 mg/dL 8.7 (L)  Anion gap Latest Ref Range: 5 - 15  13    Results for AMUN, STEMM (MRN [id number]) as of 10/17/2017 09:45  Ref. Range 10/17/2017 06:00 11/04/2017 06:35 10/12/2017 06:50 10/22/2017 06:54 11/11/2017 09:47 10/14/2017 10:24 10/29/2017 14:02 10/14/2017 16:45 11/10/2017 21:12 10/17/2017 04:12  Troponin I Latest Ref Range: <0.03 ng/mL 0.39 (HH)    22.17 (HH)   47.59 (HH) 39.10 (HH) 29.05 [facility])    Results for ROC, STREETT (MRN [id number]) as of 10/17/2017 09:45  Ref. Range 10/14/2017 14:02 10/15/2017 16:45  10/18/2017 21:12 10/17/2017 04:12 10/17/2017 05:12  pH, Arterial Latest Ref Range: 7.350 - 7.450  7.398    7.479 (H)  pCO2 arterial Latest Ref Range: 32.0 - 48.0 mmHg 34.1    25.7 (L)  pO2, Arterial Latest Ref Range: 83.0 - 108.0 mmHg 95.0    147.0 (H)  TCO2 Latest Ref Range: 22 - 32 mmol/L 22    20 (L)   Results for TORENCE, PALMERI (MRN [id number]) as of 10/17/2017 09:45  Ref. Range 10/17/2017 04:12 10/17/2017 05:12 10/17/2017 06:30 10/17/2017 06:42 10/17/2017 08:06  Glucose-Capillary Latest Ref Range: 65 - 99 mg/dL     140 (H)  Sample type Unknown  ARTERIAL     pH, Arterial Latest Ref Range: 7.350 - 7.450   7.479 (H)     pCO2 arterial Latest Ref Range: 32.0 - 48.0 mmHg  25.7 (L)     pO2, Arterial Latest Ref Range: 83.0 - 108.0 mmHg  147.0 (H)     TCO2 Latest Ref Range: 22 - 32 mmol/L  20 (L)     Acid-base deficit Latest Ref Range: 0.0 - 2.0 mmol/L  3.0 (H)     Bicarbonate Latest Ref Range: 20.0 - 28.0 mmol/L  19.2 (L)     O2 Saturation Latest Units: %  99.0     Patient temperature Unknown  97.9 F     Collection site Unknown  ARTERIAL LINE     Phosphorus Latest Ref Range: 2.5 - 4.6 mg/dL 3.1      Magnesium Latest Ref Range: 1.7 - 2.4 mg/dL 1.9      Alkaline Phosphatase Latest Ref Range: 38 - 126 U/L 48      Albumin Latest Ref Range: 3.5 - 5.0 g/dL 3.1 (L)      AST Latest Ref Range: 15 - 41 U/L 154 (H)      ALT Latest Ref Range: 17 - 63 U/L 69 (H)      Total Protein Latest Ref Range: 6.5 - 8.1 g/dL 5.6 (L)      Bilirubin, Direct Latest Ref Range: 0.1 - 0.5 mg/dL 0.1      Indirect Bilirubin Latest Ref Range: 0.3 - 0.9 mg/dL 0.6      Total Bilirubin Latest Ref Range: 0.3 - 1.2 mg/dL 0.7      Troponin I Latest Ref Range: <0.03 ng/mL 29.05 (HH)  WBC Latest Ref Range: 4.0 - 10.5 K/uL 9.7      RBC Latest Ref Range: 4.22 - 5.81 MIL/uL 3.99 (L)      Hemoglobin Latest Ref Range: 13.0 - 17.0 g/dL 12.0 (L)      HCT Latest Ref Range: 39.0 - 52.0 % 35.1 (L)      MCV Latest Ref Range: 78.0 - 100.0 fL 88.0       MCH Latest Ref Range: 26.0 - 34.0 pg 30.1      MCHC Latest Ref Range: 30.0 - 36.0 g/dL 34.2      RDW Latest Ref Range: 11.5 - 15.5 % 13.6      Platelets Latest Ref Range: 150 - 400 K/uL 245          ASSESSMENT / PLAN:    47 Y/O DIABETIC MALE, SMOKER, S/P V FIB ARREST- ROSC < 5 MIN, POST LCX PCI /DES   MULTIVESSEL CAD  ACUTE RESPIRATORY FAILURE WITH MET/RESP ACIDOSIS   R 3,4 LATERAL RIB FXR SEC TO CPR- NO EVIDENCE OF BAROTRAUMA  AT RISK FOR ANOXIC ENCEPHALOPATHY  STRESS>>LEUKOCYTOSIS  HIGH LDL  AT RISK FOR SHOCK LIVER AND ATN/AKI  Hx Chronic neck pain  H/o DM, hyperlipidemia, tobacco use      Plan: CNS-  Goal RASS -2 EEG Defer to Neuro reg AED Rx Re evaluate Neuro status after cooling completed today appreciate NEUROLOGY CONSULT    CVS  on   ANTIPLATELETS, STATIN, BETA BLOCKER, ASA IV amiodarone for VT/VF Further PCI  when recovered neurologically and extubated   Resp FULL VENT SUPPORT;  reduced Ve; f/u ABG   GI,  NPO On PUD PROPHYLAXIS   Renal MONITOR LYTES,   Endocrine MONITOR SUGARS  Heme MONITOR CBC On DVT  PROPHYLAXIS  LINES/TUBES: 3/5 ETT >> 3/5 LIJ CVL >> 3/5 OGT 3/5 Left Femoral a line  FAMILY COUNSELING- updated father at bedside  Today D/w Cardiology, pharm   CRITICALLY ILL PROGNOSIS GUARDED    Thank you for letting me participate in the care of your patient. ^^^^^^^^^^ I  Have personally spent   50 Minutes  In the care of this Patient providing Critical care Services; Time includes review of chart, labs, imaging, coordinating care with other physicians and healthcare team members. Also includes time for frequent reevaluation and additional treatment implementation due to change in clinical condiiton of patient. Excludes time spent for Procedure and Teaching.  ^^^^^^^^^^ Note subject to typographical and grammatical errors;   Any formal questions or concerns about the content, text, or information contained  within the body of this dictation should be directly addressed to the physician  for  clarification.  Evans Lance, MD Pulmonary and [place] Pager: [phone]

## 2017-10-17 NOTE — Progress Notes (Signed)
Progress Note  Patient Name: Alexander Torres Date of Encounter: 10/17/2017  Primary Cardiologist: new  Dr. Julianne Handler  Subjective   Intubated sedated on hypothermia  Inpatient Medications    Scheduled Meds: . aspirin  81 mg Oral Daily  . atorvastatin  80 mg Oral q1800  . chlorhexidine gluconate (MEDLINE KIT)  15 mL Mouth Rinse BID  . Chlorhexidine Gluconate Cloth  6 each Topical Daily  . docusate  100 mg Per Tube BID  . famotidine  20 mg Per Tube BID  . fentaNYL (SUBLIMAZE) injection  100 mcg Intravenous Once  . heparin injection (subcutaneous)  5,000 Units Subcutaneous Q8H  . insulin aspart  1-3 Units Subcutaneous Q4H  . mouth rinse  15 mL Mouth Rinse 10 times per day  . sennosides  5 mL Per Tube QHS  . sodium chloride flush  10-40 mL Intracatheter Q12H  . ticagrelor  90 mg Oral BID   Continuous Infusions: . sodium chloride    . sodium chloride    . sodium chloride 10 mL/hr at 10/17/17 0600  . amiodarone 30 mg/hr (10/17/17 0600)  . fentaNYL infusion INTRAVENOUS 200 mcg/hr (10/17/17 0612)  . levETIRAcetam Stopped (10/17/17 2778)  . norepinephrine (LEVOPHED) Adult infusion    . propofol (DIPRIVAN) infusion 20 mcg/kg/min (10/17/17 0612)   PRN Meds: sodium chloride, sodium chloride, acetaminophen **OR** acetaminophen (TYLENOL) oral liquid 160 mg/5 mL, albuterol, fentaNYL, ondansetron (ZOFRAN) IV, sodium chloride flush   Vital Signs    Vitals:   10/17/17 0500 10/17/17 0600 10/17/17 0700 10/17/17 0738  BP: (!) 73/58 (!) 83/61 (!) 81/63   Pulse:      Resp: (!) 24 (!) 24 (!) 24   Temp: (!) 96.3 F (35.7 C) (!) 95.7 F (35.4 C) (!) 95.2 F (35.1 C)   TempSrc:      SpO2:    100%  Weight:      Height:        Intake/Output Summary (Last 24 hours) at 10/17/2017 0757 Last data filed at 10/17/2017 0600 Gross per 24 hour  Intake 2468.51 ml  Output 2325 ml  Net 143.51 ml    I/O since admission: +243  Filed Weights   10/25/2017 0602 10/17/17 0421  Weight: 242 lb 8.1 oz  (110 kg) 205 lb 14.6 oz (93.4 kg)    Telemetry    Sinus ~90 - 100 - Personally Reviewed  ECG    ECG (independently read by me): NSR at 71; resolution of STT changes; QTc 519  10/29/2017 ECG (independently read by me): AF at 133, marked ST elevation 2,3,aVF, V5-6 with significant ST depression I,aVL, V1-4  Physical Exam   BP (!) 81/63   Pulse 79   Temp (!) 95.2 F (35.1 C)   Resp (!) 24   Ht 5' 11.5" (1.816 m)   Wt 205 lb 14.6 oz (93.4 kg)   SpO2 100%   BMI 28.32 kg/m  General: sedated on vent  Skin: normal turgor, no rashes, warm and dry HEENT: Normocephalic, atraumatic. Nose without nasal septal hypertrophy Mouth/Parynx benign;  Neck: No JVD, no carotid bruits; normal carotid upstroke Lungs: clear to ausculatation and percussion; no wheezing or rales Chest wall: without tenderness to palpitation Heart: PMI not displaced, RRR, s1 s2 normal, 1/6 systolic murmur, no diastolic murmur, no rubs, gallops, thrills, or heaves Abdomen: soft, nontender; no hepatosplenomehaly, BS+; abdominal aorta nontender and not dilated by palpation. Back: no CVA tenderness Pulses 2+; R groin cath site stable Musculoskeletal: , no joint deformities  Extremities: no clubbing cyanosis or edema, Homan's sign negative  Neurologic: no seizure activity Psychologic: not assessed   Labs    Chemistry Recent Labs  Lab 10/31/2017 0600 10/31/2017 0650 10/27/2017 0947 11/09/2017 2112 10/17/17 0412  NA 137  --  138 135  --   K 3.9  --  3.8 4.7  --   CL 103  --  104 105  --   CO2 15*  --  18* 17*  --   GLUCOSE 260*  --  165* 161*  --   BUN 7  --  8 6  --   CREATININE 1.09  --  0.83 0.63  --   CALCIUM 8.7*  --  9.0 8.7*  --   PROT  --  6.3*  --   --  5.6*  ALBUMIN  --  3.6  --   --  3.1*  AST  --  129*  --   --  154*  ALT  --  61  --   --  69*  ALKPHOS  --  68  --   --  48  BILITOT  --  0.7  --   --  0.7  GFRNONAA >60  --  >60 >60  --   GFRAA >60  --  >60 >60  --   ANIONGAP 19*  --  16* 13  --       Hematology Recent Labs  Lab 10/27/2017 0635 10/17/17 0412  WBC 30.0* 9.7  RBC 4.62 3.99*  HGB 14.2 12.0*  HCT 42.0 35.1*  MCV 90.9 88.0  MCH 30.7 30.1  MCHC 33.8 34.2  RDW 13.7 13.6  PLT 357 245    Cardiac Enzymes Recent Labs  Lab 10/15/2017 0947 10/13/2017 1645 11/02/2017 2112 10/17/17 0412  TROPONINI 22.17* 47.59* 39.10* 29.05*   No results for input(s): TROPIPOC in the last 168 hours.   BNPNo results for input(s): BNP, PROBNP in the last 168 hours.   DDimer No results for input(s): DDIMER in the last 168 hours.   Lipid Panel     Component Value Date/Time   CHOL 226 (H) 10/24/2017 0650   TRIG 148 11/03/2017 0650   HDL 29 (L) 11/02/2017 0650   CHOLHDL 7.8 11/11/2017 0650   VLDL 30 10/13/2017 0650   LDLCALC 167 (H) 11/05/2017 0650    Radiology    Ct Head Wo Contrast  Result Date: 10/29/2017 CLINICAL DATA:  Status post CPR. EXAM: CT HEAD WITHOUT CONTRAST TECHNIQUE: Contiguous axial images were obtained from the base of the skull through the vertex without intravenous contrast. COMPARISON:  07/18/2010 FINDINGS: Brain: No evidence of acute infarction, hemorrhage, hydrocephalus, extra-axial collection or mass lesion/mass effect. The gray-white matter junction is well preserved. No significant edema identified. Vascular: No hyperdense vessel or unexpected calcification. Skull: Normal. Negative for fracture or focal lesion. Sinuses/Orbits: No acute finding. Other: Enteric tube and ET tubes are identified. IMPRESSION: 1. No acute intracranial abnormalities.  Normal brain. Electronically Signed   By: Kerby Moors M.D.   On: 10/28/2017 11:27   Dg Chest Port 1 View  Result Date: 11/02/2017 CLINICAL DATA:  Status post endotracheal tube positioning. EXAM: PORTABLE CHEST 1 VIEW COMPARISON:  Radiograph of same day. FINDINGS: Endotracheal tube is in good position with distal tip 3 cm above the carina. Nasogastric tube is unchanged in position. Left internal jugular catheter is unchanged  with tip in expected position of the SVC. Stable cardiomediastinal silhouette. No pneumothorax is noted. Mild bibasilar subsegmental atelectasis is noted.  Bony thorax is unremarkable. IMPRESSION: Endotracheal and nasogastric tubes in grossly good position. Mild bibasilar subsegmental atelectasis. Electronically Signed   By: Marijo Conception, M.D.   On: 10/17/2017 12:20   Portable Chest X-ray  Result Date: 10/15/2017 CLINICAL DATA:  Central line placement EXAM: PORTABLE CHEST 1 VIEW COMPARISON:  Chest x-ray of 11/09/2016 FINDINGS: A left IJ central venous line overlies the mid SVC. No pneumothorax is seen. The tip of the endotracheal tube is approximately 6.0 cm above the carina. Somewhat prominent perihilar markings are present which may indicate bronchitis with some peribronchial thickening. Mild volume loss is present at the right lung base. Heart is within normal limits in size. NG tube extends below the hemidiaphragm. There are acute fractures the least the right third and fourth ribs laterally near the axilla. The lower right ribs are not included on chest x-ray. IMPRESSION: 1. Left IJ central venous line tip overlies the mid SVC. No pneumothorax. 2. Endotracheal tube 6.0 cm above the carina. 3. Mild right basilar atelectasis.  Question bronchitis. 4. Acute fractures of the right lateral third and fourth ribs. Electronically Signed   By: Ivar Drape M.D.   On: 10/27/2017 09:36    Cardiac Studies   11/02/2017 Cath/PCI Conclusion     Ost Cx lesion is 100% stenosed.  Ost LAD to Prox LAD lesion is 65% stenosed.  Ost 1st Diag lesion is 90% stenosed.  Prox LAD lesion is 65% stenosed.  Ost RCA to Prox RCA lesion is 80% stenosed.  A drug-eluting stent was successfully placed using a STENT SYNERGY DES 3X24.  Post intervention, there is a 0% residual stenosis.  Mid Cx lesion is 90% stenosed.  Ost 2nd Mrg lesion is 70% stenosed.   1. Acute inferolateral STEMI secondary to proximal occlusion of  the large, dominant Circumflex artery.  2. Successful PTCA/DES x 1 proximal Circumflex artery 3. Moderately severe stenosis in the proximal LAD and mid LAD 4. Residual stenosis in the mid AV groove Circumflex artery and ostium of the first OM branch.  5. Severe stenosis in the small, non-dominant RCA  Recommendations: He will be admitted to the ICU. PCCM to admit and begin cooling protocol. I will continue IV amiodarone. The IV Cangrelor will be continued for now and will be stopped once his Brilinta loading dose has been given. Rectal ASA will be given now. High dose statin will be started. Echo today to assess LVEF. I will ask the CHF/Shock team to follow.  If he makes a full neurological recovery, will need repeat cardiac to consider PCI of the Circumflex and LAD.           ------------------------------------------------------------------- 11/05/2017 ECHO Study Conclusions  - Left ventricle: The cavity size was normal. Wall thickness was   normal. Systolic function was moderately reduced. The estimated   ejection fraction was in the range of 35% to 40%. Severe   hypokinesis of the entireinferolateral myocardium; consistent   with ischemia or infarction in the distribution of the left   circumflex coronary artery. The study is not technically   sufficient to allow evaluation of LV diastolic function. Acoustic   contrast opacification revealed no evidence ofthrombus. - Mitral valve: Calcified annulus. There was mild regurgitation. - Left atrium: The atrium was mildly dilated. - Right ventricle: Systolic function was mildly reduced.  Patient Profile     47 y.o. male who presented after an out of hospital cardiac arrest/post CPR and underwent emergent cath/PCI to totally occluded LCx.  Assessment &  Plan    1. Day 1 out of hospital VF cardiac arrest; ? Down time prior to CPR > 10 minutes by report; currently undergoing hypothermia;  to end tonight at 10 pm tonight and initiate  re-warming.  2. LCX STEMI; s/p DES stent to LCX; resolution of ECG changes; initial EF 35 - 40%.Peak troponin 47.59.  Currently not on pressors.   3. Concomitant CAD of distal LCX; RCA and LAD; depending of neuro recovery, will need PCI  4. Respiratory Failure: on vent per CCM  5. Anoxic encephalopy;  Neuro to follow with EEG; no seizures reported.  6. Hyperlipidemia: LDL 67; now on atorvastatin 80 mg.  7. Prolonged QTc; 519 on amiodarone; Mg 1.9; Ca 8.7' K 4.7  8. LFT elevation: prob from MI, hypotension, rather than atorvastatin; monitor  9. DM: on sliding scale insulin  10: H/O tobacco  Critical care: 50 minutes  Signed, Troy Sine, MD, [facility] 10/17/2017, 7:57 AM

## 2017-10-18 ENCOUNTER — Inpatient Hospital Stay (HOSPITAL_COMMUNITY): Payer: BC Managed Care – PPO

## 2017-10-18 DIAGNOSIS — R569 Unspecified convulsions: Secondary | ICD-10-CM

## 2017-10-18 DIAGNOSIS — J9601 Acute respiratory failure with hypoxia: Secondary | ICD-10-CM

## 2017-10-18 DIAGNOSIS — G40901 Epilepsy, unspecified, not intractable, with status epilepticus: Secondary | ICD-10-CM

## 2017-10-18 LAB — GLUCOSE, CAPILLARY
GLUCOSE-CAPILLARY: 103 mg/dL — AB (ref 65–99)
GLUCOSE-CAPILLARY: 115 mg/dL — AB (ref 65–99)
Glucose-Capillary: 98 mg/dL (ref 65–99)
Glucose-Capillary: 103 mg/dL — ABNORMAL HIGH (ref 65–99)
Glucose-Capillary: 119 mg/dL — ABNORMAL HIGH (ref 65–99)
Glucose-Capillary: 121 mg/dL — ABNORMAL HIGH (ref 65–99)

## 2017-10-18 LAB — POCT I-STAT, CHEM 8
BUN: 11 mg/dL (ref 6–20)
CALCIUM ION: 1.2 mmol/L (ref 1.15–1.40)
CHLORIDE: 98 mmol/L — AB (ref 101–111)
Creatinine, Ser: 1 mg/dL (ref 0.61–1.24)
Glucose, Bld: 132 mg/dL — ABNORMAL HIGH (ref 65–99)
HCT: 31 % — ABNORMAL LOW (ref 39.0–52.0)
Hemoglobin: 10.5 g/dL — ABNORMAL LOW (ref 13.0–17.0)
Potassium: 3.4 mmol/L — ABNORMAL LOW (ref 3.5–5.1)
Sodium: 133 mmol/L — ABNORMAL LOW (ref 135–145)
TCO2: 22 mmol/L (ref 22–32)

## 2017-10-18 LAB — COMPREHENSIVE METABOLIC PANEL
ALT: 48 U/L (ref 17–63)
ANION GAP: 10 (ref 5–15)
AST: 77 U/L — ABNORMAL HIGH (ref 15–41)
Albumin: 2.9 g/dL — ABNORMAL LOW (ref 3.5–5.0)
Alkaline Phosphatase: 50 U/L (ref 38–126)
BUN: <5 mg/dL — ABNORMAL LOW (ref 6–20)
CHLORIDE: 102 mmol/L (ref 101–111)
CO2: 22 mmol/L (ref 22–32)
CREATININE: 0.56 mg/dL — AB (ref 0.61–1.24)
Calcium: 8.7 mg/dL — ABNORMAL LOW (ref 8.9–10.3)
GFR calc non Af Amer: >60 mL/min (ref >60)
Glucose, Bld: 126 mg/dL — ABNORMAL HIGH (ref 65–99)
Potassium: 4 mmol/L (ref 3.5–5.1)
SODIUM: 134 mmol/L — AB (ref 135–145)
Total Bilirubin: 0.6 mg/dL (ref 0.3–1.2)
Total Protein: 5.5 g/dL — ABNORMAL LOW (ref 6.5–8.1)

## 2017-10-18 LAB — POCT I-STAT 3, ART BLOOD GAS (G3+)
ACID-BASE DEFICIT: 8 mmol/L — AB (ref 0.0–2.0)
Bicarbonate: 20.5 mmol/L (ref 20.0–28.0)
O2 SAT: 83 %
PCO2 ART: 52.5 mmHg — AB (ref 32.0–48.0)
Patient temperature: 36.6
TCO2: 22 mmol/L (ref 22–32)
pH, Arterial: 7.197 — CL (ref 7.350–7.450)
pO2, Arterial: 57 mmHg — ABNORMAL LOW (ref 83.0–108.0)

## 2017-10-18 LAB — BLOOD GAS, ARTERIAL
Acid-base deficit: 1.7 mmol/L (ref 0.0–2.0)
BICARBONATE: 23.1 mmol/L (ref 20.0–28.0)
FIO2: 40
MECHVT: 550 mL
O2 Saturation: 98.5 %
PCO2 ART: 42.4 mmHg (ref 32.0–48.0)
PEEP/CPAP: 5 cmH2O
PH ART: 7.355 (ref 7.350–7.450)
Patient temperature: 98.6
RATE: 16 resp/min
pO2, Arterial: 122 mmHg — ABNORMAL HIGH (ref 83.0–108.0)

## 2017-10-18 LAB — CBC
HCT: 31.8 % — ABNORMAL LOW (ref 39.0–52.0)
Hemoglobin: 10.7 g/dL — ABNORMAL LOW (ref 13.0–17.0)
MCH: 30.2 pg (ref 26.0–34.0)
MCHC: 33.6 g/dL (ref 30.0–36.0)
MCV: 89.8 fL (ref 78.0–100.0)
Platelets: 216 10*3/uL (ref 150–400)
RBC: 3.54 MIL/uL — ABNORMAL LOW (ref 4.22–5.81)
RDW: 13.7 % (ref 11.5–15.5)
WBC: 9.7 10*3/uL (ref 4.0–10.5)

## 2017-10-18 LAB — VALPROIC ACID LEVEL: Valproic Acid Lvl: 32 ug/mL — ABNORMAL LOW (ref 50.0–100.0)

## 2017-10-18 LAB — MAGNESIUM
Magnesium: 1.6 mg/dL — ABNORMAL LOW (ref 1.7–2.4)
Magnesium: 2.4 mg/dL (ref 1.7–2.4)
Magnesium: 2.4 mg/dL (ref 1.7–2.4)

## 2017-10-18 LAB — PHOSPHORUS
PHOSPHORUS: 3.6 mg/dL (ref 2.5–4.6)
Phosphorus: 3.5 mg/dL (ref 2.5–4.6)

## 2017-10-18 MED ORDER — VALPROATE SODIUM 500 MG/5ML IV SOLN
750.0000 mg | Freq: Three times a day (TID) | INTRAVENOUS | Status: DC
  Administered 2017-10-18 – 2017-10-19 (×2): 750 mg via INTRAVENOUS
  Filled 2017-10-18 (×3): qty 7.5

## 2017-10-18 MED ORDER — PRO-STAT SUGAR FREE PO LIQD
60.0000 mL | Freq: Four times a day (QID) | ORAL | Status: DC
  Administered 2017-10-18 (×3): 60 mL
  Filled 2017-10-18 (×3): qty 60

## 2017-10-18 MED ORDER — LORAZEPAM 2 MG/ML IJ SOLN
INTRAMUSCULAR | Status: AC
  Filled 2017-10-18: qty 1

## 2017-10-18 MED ORDER — SODIUM CHLORIDE 0.9 % IV SOLN
35.0000 mg/h | INTRAVENOUS | Status: DC
  Administered 2017-10-18 (×2): 35 mg/h via INTRAVENOUS
  Administered 2017-10-18: 0.5 mg/h via INTRAVENOUS
  Administered 2017-10-18: 35 mg/h via INTRAVENOUS
  Administered 2017-10-18 – 2017-10-19 (×2): 30 mg/h via INTRAVENOUS
  Administered 2017-10-19: 20 mg/h via INTRAVENOUS
  Filled 2017-10-18 (×9): qty 20

## 2017-10-18 MED ORDER — MAGNESIUM SULFATE 2 GM/50ML IV SOLN
2.0000 g | Freq: Once | INTRAVENOUS | Status: AC
  Administered 2017-10-18: 2 g via INTRAVENOUS
  Filled 2017-10-18: qty 50

## 2017-10-18 MED ORDER — LORAZEPAM 2 MG/ML IJ SOLN
2.0000 mg | Freq: Once | INTRAMUSCULAR | Status: AC
  Administered 2017-10-18: 2 mg via INTRAVENOUS

## 2017-10-18 MED ORDER — MIDAZOLAM BOLUS VIA INFUSION
10.0000 mg | Freq: Once | INTRAVENOUS | Status: AC
  Administered 2017-10-18: 10 mg via INTRAVENOUS

## 2017-10-18 MED ORDER — LORAZEPAM 2 MG/ML IJ SOLN
2.0000 mg | Freq: Once | INTRAMUSCULAR | Status: AC
  Administered 2017-10-18: 2 mg via INTRAVENOUS
  Filled 2017-10-18: qty 1

## 2017-10-18 MED ORDER — VALPROATE SODIUM 500 MG/5ML IV SOLN
1000.0000 mg | Freq: Once | INTRAVENOUS | Status: AC
  Administered 2017-10-18: 1000 mg via INTRAVENOUS
  Filled 2017-10-18: qty 10

## 2017-10-18 MED ORDER — VITAL HIGH PROTEIN PO LIQD
1000.0000 mL | ORAL | Status: DC
  Administered 2017-10-18: 1000 mL

## 2017-10-18 MED ORDER — SODIUM CHLORIDE 0.9 % IV SOLN: 10.0000 mg/h | INTRAVENOUS | Status: DC

## 2017-10-18 MED ORDER — NOREPINEPHRINE BITARTRATE 1 MG/ML IV SOLN
0.0000 ug/min | INTRAVENOUS | Status: DC
  Administered 2017-10-19: 90 ug/min via INTRAVENOUS
  Administered 2017-10-19: 13 ug/min via INTRAVENOUS
  Administered 2017-10-19: 80 ug/min via INTRAVENOUS
  Filled 2017-10-18 (×4): qty 16

## 2017-10-18 MED ORDER — VASOPRESSIN 20 UNIT/ML IV SOLN
0.0300 [IU]/min | INTRAVENOUS | Status: DC
  Administered 2017-10-18: 0.03 [IU]/min via INTRAVENOUS
  Filled 2017-10-18 (×2): qty 2

## 2017-10-18 MED ORDER — LACOSAMIDE 200 MG/20ML IV SOLN
200.0000 mg | Freq: Two times a day (BID) | INTRAVENOUS | Status: DC
  Administered 2017-10-18 (×2): 200 mg via INTRAVENOUS
  Filled 2017-10-18 (×4): qty 20

## 2017-10-18 MED ORDER — SODIUM BICARBONATE 8.4 % IV SOLN
INTRAVENOUS | Status: AC
  Filled 2017-10-18: qty 50

## 2017-10-18 MED ORDER — MIDAZOLAM BOLUS VIA INFUSION
10.0000 mg | Freq: Once | INTRAVENOUS | Status: AC
  Administered 2017-10-18: 10 mg via INTRAVENOUS
  Filled 2017-10-18: qty 10

## 2017-10-18 MED ORDER — SODIUM BICARBONATE 8.4 % IV SOLN
INTRAVENOUS | Status: AC
  Administered 2017-10-18: 50 meq
  Filled 2017-10-18: qty 50

## 2017-10-18 MED ORDER — SODIUM CHLORIDE 0.9 % IV SOLN
Freq: Once | INTRAVENOUS | Status: AC
  Administered 2017-10-18: 23:00:00 via INTRAVENOUS

## 2017-10-18 MED ORDER — SODIUM CHLORIDE 0.9 % IV SOLN
10.0000 mg/h | INTRAVENOUS | Status: DC
  Filled 2017-10-18: qty 10

## 2017-10-18 NOTE — Progress Notes (Signed)
CRITICAL CARE PROGRESS NOTE   ICU day #     3    Hospital day 3       Mechanical Ventilation day # 3  Patient Summary:    Name: Alexander Torres MRN:   [id number] DOB:   06-Nov-1970          ADMISSION DATE:  10/29/2017 CONSULTATION DATE:  10/27/2017 REFERRING MD:  Dr. Angelena Form CHIEF COMPLAINT:  Cardiac Arrest HISTORY OF PRESENT ILLNESS:   HPI obtained from medical chart review and from patient's wife in waiting room as patient is intubated and mechanically intubated.    47 year old male with past medical history significant for Diabetes and smoking presented from home 3/5 after cardiac arrest. Wife reports patient has been complaining of intermittent left arm pain for the last 3 days.  Denied any recent illness, complaints of chest pain or shortness of breath.   Wife reports strong heart disease on patient's father side. The pain woke him up this morning.  Wife talked with him then heard him collapse about 10 mins later and found him on couch unresponsive.  She started CPR per 911 instructions.  He was found in ventricular fibrilliation by EMS, defibrillated x 1 with 4 mins of CPR prior to ROSC.  Unclear total downtime.  He was given narcan without response by the [facility] department and again with EMS.  He remained agonal and unresponsive requiring King airway.  Inferior STEMI noted on EKG.  On arrival to ER, [name] airway exchanged and then taken to the cath lab by Cardiology.  Blood pressure stable and noted to be in Afib with RVR. In cath lab he was found to have severe diffuse coronary disease. LCx was revascularized. During reperfusion he suffered several runs of VT requiring 6 shocks, but no additional CPR.  He remained unresponsive and therefore cooling protocol started.    PAST MEDICAL HISTORY :  He  has a past medical history of Diabetes mellitus ([place]). Tobacco abuse. PAST SURGICAL HISTORY: He  has a past surgical history that includes Coronary/Graft Acute MI Revascularization (N/A,  10/31/2017) and LEFT HEART CATH AND CORONARY ANGIOGRAPHY (N/A, 11/01/2017).   New Events  3/6 On hypothermia protocol>TTM - 35C No szr on EEG (CONT) No myoclonus No secretions Good urine outpt No further arrythmias- on IV amiodarone   3/7 Reportedly had been unresponsive yesterday evening.  He showed nonconvulsive status activity. Neurology modifying antiepileptic therapy. Minimal endotracheal secretions.  No fever.   Marland Kitchen aspirin  81 mg Oral Daily  . atorvastatin  80 mg Oral q1800  . chlorhexidine gluconate (MEDLINE KIT)  15 mL Mouth Rinse BID  . Chlorhexidine Gluconate Cloth  6 each Topical Daily  . docusate  100 mg Per Tube BID  . famotidine  20 mg Per Tube BID  . fentaNYL (SUBLIMAZE) injection  100 mcg Intravenous Once  . heparin injection (subcutaneous)  5,000 Units Subcutaneous Q8H  . insulin aspart  1-3 Units Subcutaneous Q4H  . mouth rinse  15 mL Mouth Rinse 10 times per day  . sennosides  5 mL Per Tube QHS  . sodium chloride flush  10-40 mL Intracatheter Q12H  . ticagrelor  90 mg Oral BID   PHYSICAL EXAMINATION: Blood pressure (!) 73/54, pulse 70, temperature 99.3 F (37.4 C), resp. rate 16, height 5' 11.5" (1.816 m), weight 218 lb 11.1 oz (99.2 kg), SpO2 100 %.  General:  Middle aged male on vent HEENT:  [place]/AT, Pupils equal, reacting; no JVD Cardiovascular:  Tachy, regular, no MRG, s1 s2 - no murmur Lungs:  Coarse bilateral breath sounds. Vent supported breaths.  Abdomen:  Soft, non-distended, BS+ Musculoskeletal:  No acute deformity No edema Tone- normal Skin:  Grossly intact  Vent Mode: PRVC FiO2 (%):  [40 %] 40 % Set Rate:  [16 bmp] 16 bmp Vt Set:  [550 mL] 550 mL PEEP:  [5 cmH20] 5 cmH20 Plateau Pressure:  [14 cmH20-21 cmH20] 18 cmH20   CT brain 3/6  1. No acute intracranial abnormalities.  Normal brain.  10/28/2017 ekg Atrial fibrillation Inferoposterior infarct, acute (RCA) Probable RV involvement, suggest recording right precordial leads  3/6 TTE -  Left ventricle: The cavity size was normal. Wall thickness was   normal. Systolic function was moderately reduced. The estimated   ejection fraction was in the range of 35% to 40%. Severe   hypokinesis of the entireinferolateral myocardium; consistent   with ischemia or infarction in the distribution of the left   circumflex coronary artery. The study is not technically   sufficient to allow evaluation of LV diastolic function. Acoustic   contrast opacification revealed no evidence ofthrombus. - Mitral valve: Calcified annulus. There was mild regurgitation. - Left atrium: The atrium was mildly dilated. - Right ventricle: Systolic function was mildly reduced.    3/7-chest x-ray Tip of the endotracheal tube is above the carina-6 cm. Tip of the nasogastric tube is in the gastroesophageal junction. Minimal bibasilar atelectasis   18-Oct-2017 06:33:09 EKG   Normal sinus rhythm Nonspecific ST and T wave abnormality Prolonged QT Abnormal ECG    Coronary/Graft Acute MI Revascularization  LEFT HEART CATH AND CORONARY ANGIOGRAPHY  11/09/2017   Ost Cx lesion is 100% stenosed.  Ost LAD to Prox LAD lesion is 65% stenosed.  Ost 1st Diag lesion is 90% stenosed.  Prox LAD lesion is 65% stenosed.  Ost RCA to Prox RCA lesion is 80% stenosed.  A drug-eluting stent was successfully placed using a STENT SYNERGY DES 3X24.  Post intervention, there is a 0% residual stenosis.  Mid Cx lesion is 90% stenosed.  Ost 2nd Mrg lesion is 70% stenosed.  1. Acute inferolateral STEMI secondary to proximal occlusion of the large, dominant Circumflex artery.  2. Successful PTCA/DES x 1 proximal Circumflex artery 3. Moderately severe stenosis in the proximal LAD and mid LAD 4. Residual stenosis in the mid AV groove Circumflex artery and ostium of the first OM branch.  5. Severe stenosis in the small, non-dominant RCA Recommendations: He will be admitted to the ICU. PCCM to admit and begin cooling  protocol. I will continue IV amiodarone. The IV Cangrelor will be continued for now and will be stopped once his Brilinta loading dose has been given. Rectal ASA will be given now. High dose statin will be started. Echo today to assess LVEF. I will ask the CHF/Shock team to follow.  If he makes a full neurological recovery, will need repeat cardiac to consider PCI of the Circumflex and LAD.  Results for HITOSHI, WERTS (MRN [id number]) as of 10/18/2017 11:39  Ref. Range 10/18/2017 04:05  pH, Arterial Latest Ref Range: 7.350 - 7.450  7.355  pCO2 arterial Latest Ref Range: 32.0 - 48.0 mmHg 42.4  pO2, Arterial Latest Ref Range: 83.0 - 108.0 mmHg 122 (H)  Acid-base deficit Latest Ref Range: 0.0 - 2.0 mmol/L 1.7  Bicarbonate Latest Ref Range: 20.0 - 28.0 mmol/L 23.1   Results for LOU, LOEWE (MRN [id number]) as of 10/18/2017 11:39  Ref. Range 10/18/2017 03:54  Sodium Latest Ref Range: 135 - 145 mmol/L 134 (L)  Potassium Latest Ref Range: 3.5 - 5.1 mmol/L 4.0  Chloride Latest Ref Range: 101 - 111 mmol/L 102  CO2 Latest Ref Range: 22 - 32 mmol/L 22  Glucose Latest Ref Range: 65 - 99 mg/dL 126 (H)  BUN Latest Ref Range: 6 - 20 mg/dL <5 (L)  Creatinine Latest Ref Range: 0.61 - 1.24 mg/dL 0.56 (L)  Calcium Latest Ref Range: 8.9 - 10.3 mg/dL 8.7 (L)  Anion gap Latest Ref Range: 5 - 15  10  Magnesium Latest Ref Range: 1.7 - 2.4 mg/dL 1.6 (L)  Alkaline Phosphatase Latest Ref Range: 38 - 126 U/L 50  Albumin Latest Ref Range: 3.5 - 5.0 g/dL 2.9 (L)  AST Latest Ref Range: 15 - 41 U/L 77 (H)  ALT Latest Ref Range: 17 - 63 U/L 48  Total Protein Latest Ref Range: 6.5 - 8.1 g/dL 5.5 (L)  Total Bilirubin Latest Ref Range: 0.3 - 1.2 mg/dL 0.6  GFR, Est Non African American Latest Ref Range: >60 mL/min >60    Results for TALOR, CHEEMA (MRN [id number]) as of 10/18/2017 11:39  Ref. Range 10/18/2017 03:54  WBC Latest Ref Range: 4.0 - 10.5 K/uL 9.7  RBC Latest Ref Range: 4.22 - 5.81 MIL/uL 3.54 (L)  Hemoglobin Latest  Ref Range: 13.0 - 17.0 g/dL 10.7 (L)  HCT Latest Ref Range: 39.0 - 52.0 % 31.8 (L)  MCV Latest Ref Range: 78.0 - 100.0 fL 89.8  MCH Latest Ref Range: 26.0 - 34.0 pg 30.2  MCHC Latest Ref Range: 30.0 - 36.0 g/dL 33.6  RDW Latest Ref Range: 11.5 - 15.5 % 13.7  Platelets Latest Ref Range: 150 - 400 K/uL 216   ASSESSMENT / PLAN:    47 Y/O DIABETIC MALE, SMOKER, S/P V FIB ARREST- ROSC < 5 MIN, POST LCX PCI /DES   MULTIVESSEL CAD  ACUTE RESPIRATORY FAILURE WITH MET/RESP ACIDOSIS   R 3,4 LATERAL RIB FXR SEC TO CPR- NO EVIDENCE OF BAROTRAUMA  AT RISK FOR ANOXIC ENCEPHALOPATHY  STRESS>>LEUKOCYTOSIS  HIGH LDL  AT RISK FOR SHOCK LIVER AND ATN/AKI  Hx Chronic neck pain  H/o DM, hyperlipidemia, tobacco use      Plan: CNS-  Goal RASS -2 EEG  Neuro MODIFYING  AED Rx appreciate NEUROLOGY F/U     CVS  on   ANTIPLATELETS, STATIN, BETA BLOCKER, ASA IV amiodarone for VT/VF Further PCI  When AND IF  recovered neurologically and extubated   Resp FULL VENT SUPPORT;  LIKELY NEEDS TRACH/PEG IF VENTILATOR DEPENDENT >10 DAYS   GI,  START TUBE FEEDS On PUD PROPHYLAXIS   Renal MONITOR LYTES,  REPLETE MG   Endocrine MONITOR SUGARS  Heme MONITOR CBC On DVT  PROPHYLAXIS  LINES/TUBES: 3/5 ETT >> 3/5 LIJ CVL >> 3/5 OGT 3/5 Left Femoral a line  FAMILY COUNSELING- updated father at bedside  Today D/w Cardiology, pharm   CRITICALLY ILL PROGNOSIS GUARDED    FAMILY COUNSELED  Thank you for letting me participate in the care of your patient. ^^^^^^^^^^ I  Have personally spent  55  Minutes  In the care of this Patient providing Critical care Services; Time includes review of chart, labs, imaging, coordinating care with other physicians and healthcare team members. Also includes time for frequent reevaluation and additional treatment implementation due to change in clinical condiiton of patient. Excludes time spent for Procedure and Teaching.  ^^^^^^^^^^ Note  subject to typographical and grammatical errors;   Any  formal questions or concerns about the content, text, or information contained within the body of this dictation should be directly addressed to the physician  for  clarification.  Evans Lance, MD Pulmonary and [place] Pager: [phone]

## 2017-10-18 NOTE — Progress Notes (Signed)
Progress Note  Patient Name: Alexander Torres Date of Encounter: 10/18/2017  Primary Cardiologist: new  Dr. Julianne Handler  Subjective   Intubated sedated on hypothermia; Day 2 s/p out of hospital cardiac arrest  Inpatient Medications    Scheduled Meds: . aspirin  81 mg Oral Daily  . atorvastatin  80 mg Oral q1800  . chlorhexidine gluconate (MEDLINE KIT)  15 mL Mouth Rinse BID  . Chlorhexidine Gluconate Cloth  6 each Topical Daily  . docusate  100 mg Per Tube BID  . famotidine  20 mg Per Tube BID  . fentaNYL (SUBLIMAZE) injection  100 mcg Intravenous Once  . heparin injection (subcutaneous)  5,000 Units Subcutaneous Q8H  . insulin aspart  1-3 Units Subcutaneous Q4H  . LORazepam      . LORazepam  2 mg Intravenous Once  . mouth rinse  15 mL Mouth Rinse 10 times per day  . sennosides  5 mL Per Tube QHS  . sodium chloride flush  10-40 mL Intracatheter Q12H  . ticagrelor  90 mg Oral BID   Continuous Infusions: . sodium chloride    . sodium chloride    . sodium chloride 10 mL/hr at 10/18/17 0600  . amiodarone 30 mg/hr (10/18/17 0618)  . fentaNYL infusion INTRAVENOUS 200 mcg/hr (10/18/17 0600)  . lacosamide (VIMPAT) IV 200 mg (10/18/17 0841)  . levETIRAcetam 1,500 mg (10/18/17 5993)  . magnesium sulfate 1 - 4 g bolus IVPB    . midazolam (VERSED) infusion    . norepinephrine (LEVOPHED) Adult infusion 2 mcg/min (10/18/17 0854)  . propofol (DIPRIVAN) infusion 60 mcg/kg/min (10/18/17 0853)  . valproate sodium 500 mg (10/18/17 0540)   PRN Meds: sodium chloride, sodium chloride, acetaminophen **OR** acetaminophen (TYLENOL) oral liquid 160 mg/5 mL, albuterol, fentaNYL, ondansetron (ZOFRAN) IV, sodium chloride flush   Vital Signs    Vitals:   10/18/17 0400 10/18/17 0500 10/18/17 0600 10/18/17 0700  BP: (!) 83/66 (!) 79/63 (!) 81/61 (!) 79/58  Pulse: 80 75 78 83  Resp: _0 Temp: 97.9 F (36.6 C) (!) 97.5 F (36.4 C) (!) 97.5 F (36.4 C) 98.2 F (36.8 C)  TempSrc:        SpO2: 100% 100% 100% 100%  Weight:  218 lb 11.1 oz (99.2 kg)    Height:        Intake/Output Summary (Last 24 hours) at 10/18/2017 0938 Last data filed at 10/18/2017 0600 Gross per 24 hour  Intake 1232.9 ml  Output 2320 ml  Net -1087.1 ml    I/O since admission: -760  Filed Weights   10/26/2017 0602 10/17/17 0421 10/18/17 0500  Weight: 242 lb 8.1 oz (110 kg) 205 lb 14.6 oz (93.4 kg) 218 lb 11.1 oz (99.2 kg)    Telemetry    Sinus ~90 - 100 - Personally Reviewed  ECG    10/18/2017 ECG (independently read by me): NSR at 81; early transition V2 c/w posterior wall involvement; QTc 490   10/17/2017 ECG (independently read by me): NSR at 71; resolution of STT changes; QTc 519  10/31/2017 ECG (independently read by me): AF at 133, marked ST elevation 2,3,aVF, V5-6 with significant ST depression I,aVL, V1-4  Physical Exam   BP (!) 79/58   Pulse 83   Temp 98.2 F (36.8 C)   Resp 16   Ht 5' 11.5" (1.816 m)   Wt 218 lb 11.1 oz (99.2 kg)   SpO2 100%   BMI 30.08 kg/m  General: Alert, oriented, no  distress.  Skin: normal turgor, no rashes, warm and dry HEENT: Normocephalic, atraumatic. Pupils equal round and reactive to light; sclera anicteric; extraocular muscles intact;  Nose without nasal septal hypertrophy Mouth/Parynx Intubated;  Neck: No JVD, no carotid bruits; normal carotid upstroke Lungs: clear to ausculatation and percussion; no wheezing or rales Chest wall: without tenderness to palpitation Heart: PMI not displaced, RRR, s1 s2 normal, 1/6 systolic murmur, no diastolic murmur, no rubs, gallops, thrills, or heaves Abdomen: soft, nontender; no hepatosplenomehaly, BS+; abdominal aorta nontender and not dilated by palpation. Back: no CVA tenderness Pulses 2+ Musculoskeletal: full range of motion, normal strength, no joint deformities Extremities: no clubbing cyanosis or edema, Homan's sign negative  Neurologic: grossly nonfocal; Cranial nerves grossly wnl Psychologic: Normal  mood and affect   Labs    Chemistry Recent Labs  Lab 11/01/2017 0650  10/15/2017 2112 10/17/17 0412 10/17/17 1013 10/18/17 0354  NA  --    < > 135  --  135 134*  K  --    < > 4.7  --  3.8 4.0  CL  --    < > 105  --  106 102  CO2  --    < > 17*  --  20* 22  GLUCOSE  --    < > 161*  --  122* 126*  BUN  --    < > 6  --  5* <5*  CREATININE  --    < > 0.63  --  0.49* 0.56*  CALCIUM  --    < > 8.7*  --  8.7* 8.7*  PROT 6.3*  --   --  5.6*  --  5.5*  ALBUMIN 3.6  --   --  3.1*  --  2.9*  AST 129*  --   --  154*  --  77*  ALT 61  --   --  69*  --  48  ALKPHOS 68  --   --  48  --  50  BILITOT 0.7  --   --  0.7  --  0.6  GFRNONAA  --    < > >60  --  >60 >60  GFRAA  --    < > >60  --  >60 >60  ANIONGAP  --    < > 13  --  9 10   < > = values in this interval not displayed.     Hematology Recent Labs  Lab 11/03/2017 0635 10/17/17 0412 10/18/17 0354  WBC 30.0* 9.7 9.7  RBC 4.62 3.99* 3.54*  HGB 14.2 12.0* 10.7*  HCT 42.0 35.1* 31.8*  MCV 90.9 88.0 89.8  MCH 30.7 30.1 30.2  MCHC 33.8 34.2 33.6  RDW 13.7 13.6 13.7  PLT 357 245 216    Cardiac Enzymes Recent Labs  Lab 11/07/2017 0947 10/22/2017 1645 10/29/2017 2112 10/17/17 0412  TROPONINI 22.17* 47.59* 39.10* 29.05*   No results for input(s): TROPIPOC in the last 168 hours.   BNPNo results for input(s): BNP, PROBNP in the last 168 hours.   DDimer No results for input(s): DDIMER in the last 168 hours.   Lipid Panel     Component Value Date/Time   CHOL 226 (H) 11/02/2017 0650   TRIG 148 11/04/2017 0650   HDL 29 (L) 11/02/2017 0650   CHOLHDL 7.8 10/27/2017 0650   VLDL 30 11/07/2017 0650   LDLCALC 167 (H) 11/11/2017 0650    Radiology    Ct Head Wo Contrast  Result Date: 10/28/2017  CLINICAL DATA:  Status post CPR. EXAM: CT HEAD WITHOUT CONTRAST TECHNIQUE: Contiguous axial images were obtained from the base of the skull through the vertex without intravenous contrast. COMPARISON:  07/18/2010 FINDINGS: Brain: No evidence of  acute infarction, hemorrhage, hydrocephalus, extra-axial collection or mass lesion/mass effect. The gray-white matter junction is well preserved. No significant edema identified. Vascular: No hyperdense vessel or unexpected calcification. Skull: Normal. Negative for fracture or focal lesion. Sinuses/Orbits: No acute finding. Other: Enteric tube and ET tubes are identified. IMPRESSION: 1. No acute intracranial abnormalities.  Normal brain. Electronically Signed   By: Kerby Moors M.D.   On: 10/15/2017 11:27   Dg Chest Port 1 View  Result Date: 10/18/2017 CLINICAL DATA:  Acute respiratory failure, hypoxia, history of diabetes EXAM: PORTABLE CHEST 1 VIEW COMPARISON:  Portable chest x-ray of 10/17/2016 FINDINGS: The tip of the endotracheal tube is approximately 6.1 cm above the carina. Bibasilar atelectasis has increased right much greater than left. Developing pneumonia cannot be excluded. Left IJ central venous line tip overlies the mid SVC. Mild cardiomegaly is stable. IMPRESSION: 1. Tip of endotracheal tube 6.1 cm above the carina. 2. Increasing bibasilar atelectasis, right greater than left. Electronically Signed   By: Ivar Drape M.D.   On: 10/18/2017 09:33   Portable Chest Xray  Result Date: 10/17/2017 CLINICAL DATA:  Endotracheal tube. EXAM: PORTABLE CHEST 1 VIEW COMPARISON:  Yesterday FINDINGS: Endotracheal tube tip at the clavicular heads. Left IJ central line tip is at the upper SVC. An orogastric tube reaches the stomach. Improved aeration at the left base with better defined diaphragm. There is streaky left perihilar opacity favoring atelectasis. No Kerley lines, effusion, or pneumothorax. Lateral right third and fourth rib fractures, presumably from CPR. IMPRESSION: 1. Stable positioning of support hardware. 2. Lateral right third and fourth rib fractures. 3. Left perihilar atelectasis.  Inflation is overall improved. Electronically Signed   By: Monte Fantasia M.D.   On: 10/17/2017 08:26   Dg  Chest Port 1 View  Result Date: 11/04/2017 CLINICAL DATA:  Status post endotracheal tube positioning. EXAM: PORTABLE CHEST 1 VIEW COMPARISON:  Radiograph of same day. FINDINGS: Endotracheal tube is in good position with distal tip 3 cm above the carina. Nasogastric tube is unchanged in position. Left internal jugular catheter is unchanged with tip in expected position of the SVC. Stable cardiomediastinal silhouette. No pneumothorax is noted. Mild bibasilar subsegmental atelectasis is noted. Bony thorax is unremarkable. IMPRESSION: Endotracheal and nasogastric tubes in grossly good position. Mild bibasilar subsegmental atelectasis. Electronically Signed   By: Marijo Conception, M.D.   On: 10/13/2017 12:20    Cardiac Studies   10/23/2017 Cath/PCI Conclusion     Ost Cx lesion is 100% stenosed.  Ost LAD to Prox LAD lesion is 65% stenosed.  Ost 1st Diag lesion is 90% stenosed.  Prox LAD lesion is 65% stenosed.  Ost RCA to Prox RCA lesion is 80% stenosed.  A drug-eluting stent was successfully placed using a STENT SYNERGY DES 3X24.  Post intervention, there is a 0% residual stenosis.  Mid Cx lesion is 90% stenosed.  Ost 2nd Mrg lesion is 70% stenosed.   1. Acute inferolateral STEMI secondary to proximal occlusion of the large, dominant Circumflex artery.  2. Successful PTCA/DES x 1 proximal Circumflex artery 3. Moderately severe stenosis in the proximal LAD and mid LAD 4. Residual stenosis in the mid AV groove Circumflex artery and ostium of the first OM branch.  5. Severe stenosis in the small, non-dominant RCA  Recommendations: He will be admitted to the ICU. PCCM to admit and begin cooling protocol. I will continue IV amiodarone. The IV Cangrelor will be continued for now and will be stopped once his Brilinta loading dose has been given. Rectal ASA will be given now. High dose statin will be started. Echo today to assess LVEF. I will ask the CHF/Shock team to follow.  If he makes a full  neurological recovery, will need repeat cardiac to consider PCI of the Circumflex and LAD.           ------------------------------------------------------------------- 10/27/2017 ECHO Study Conclusions  - Left ventricle: The cavity size was normal. Wall thickness was   normal. Systolic function was moderately reduced. The estimated   ejection fraction was in the range of 35% to 40%. Severe   hypokinesis of the entireinferolateral myocardium; consistent   with ischemia or infarction in the distribution of the left   circumflex coronary artery. The study is not technically   sufficient to allow evaluation of LV diastolic function. Acoustic   contrast opacification revealed no evidence ofthrombus. - Mitral valve: Calcified annulus. There was mild regurgitation. - Left atrium: The atrium was mildly dilated. - Right ventricle: Systolic function was mildly reduced.  Patient Profile     47 y.o. male who presented after an out of hospital cardiac arrest/post CPR and underwent emergent cath/PCI to totally occluded LCx.  Assessment & Plan    1. Day 2 s/p out of hospital VF cardiac arrest; ? Down time prior to CPR > 10 minutes by report; s/p hypothermia; rewarmed last night; T 37.7, now  2. LCX STEMI; s/p DES stent to LCX; resolution of ECG changes; initial EF 35 - 40%.Peak troponin 47.59.  Currently not on pressors. BP now 115/70  3. Concomitant CAD of distal LCX; RCA and LAD; depending of neuro recovery, will need PCI  4. Respiratory Failure: on vent per CCM; on versed drip,propofol and fetanyl.  5. Anoxic encephalopy;  Neuro following with EEG; elliptical activity noted; improved with ativan; now on keppra, depakote and vimpat  6. Hyperlipidemia: LDL 67; now on atorvastatin 80 mg.  7. Prolonged QTc; 519 on amiodarone; Mg 1.9; Ca 8.7' K 4.7; QTc better 490 msec this am  8. LFT elevation: prob from MI, hypotension, rather than atorvastatin; monitor;  Improved today with AST 77, ALT  48.  9. DM: on sliding scale insulin  10: H/O tobacco  Critical care; 40 minutes  Signed, Troy Sine, MD, [facility] 10/18/2017, 9:38 AM

## 2017-10-18 NOTE — Procedures (Signed)
  Electroencephalogram report- LTM    Data acquisition: 10-20 electrode placement.  Additional T1, T2, and EKG electrodes; 26 channel digital referential acquisition reformatted to 18 channel/7 channel coronal bipolar     Spike detection: ON     Seizure detection: ON   Beginning time: 10/17/17 at 07 44 am  Ending time:10/18/17 at 07 56 am   CPT: 95951 Day of study: day 2   This  intensive EEG monitoring with simultaneous video monitoring was performed for this patient s/p cardiac arest to rule out seizures.  Medications: as per EMR Intubated   Day 1 There was no pushbutton activations events during this recording.  Automated spike detection program did not detect any spikes. Seizure detection program did not detect any seizures .   The background marked by attenuated delta slowing at the beginning  of the recording gradualy become faster,  reaching 7 cps  with state changes present as well.   Day 2:T he background marked by 5- 7 cps slowing with state changes. Runs of right posterior (parietal-occipital)  spikes/wave discharges were present.  At time with evolving features  c/w electrographic seizures. These tend to resolve with ativan administration. Last electrographic seizure around 1 am. Occasionally right posterior IED still present.   Clinical interpretation: This day 2  of intensive EEG monitoring with simultaneous monitoring demonstrates right posterior  IED and electrographic seizures responding to Atival administration . Seizures  laterize to the right hemisphere, localizing to the right parietal/occipital cortex.

## 2017-10-18 NOTE — Progress Notes (Signed)
Checked pt and electrodes.  Impedances with 5 ohms. No skin breakdown noted.

## 2017-10-18 NOTE — Progress Notes (Addendum)
Subjective: EEG with with recurrent prolonged subclinical seizures responsive to ativan.   This morning had some sharp activity, and ativan/lacosamide was ordered, however he seems to have gone back into status before this was given.   Exam: Vitals:   10/18/17 0600 10/18/17 0700  BP: (!) 81/61 (!) 79/58  Pulse: 78 83  Resp: 16 16  Temp: (!) 97.5 F (36.4 C) 98.2 F (36.8 C)  SpO2: 100% 100%   Gen: In bed, NAD Resp: non-labored breathing, no acute distress Abd: soft, nt  Neuro: MS: does not open eyes or follow commands.  CN: pupils reactive bilaterally, corneals intact Motor: flexion x 4. He has increased tone in all 4 ext Sensory:as above.   Pertinent Labs: VPA 32 Mg 1.6  I have spot-reviewed the EEG- Frequent right discharges, maximal at C4, P4, F4.   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 He appears to be having seizures.   At this time, I do not have any clear signs to state a clear prognosis, would continue aggresive care until a more clear recommendation could be made.  Seems to be very benzo responsive, will try versed drip.    Recommendations: 1) Continue keppra 1.5g BID 2) Additional 1g depakote this am, increase to 750mg  TID, level again tomorrow.  3) add vimpat 200mg  BID.  4) versed drip, titrate to seizure cessation 5) replete magnesium for hypomagnesemia.  6) will continue to follow.   This patient is critically ill and at significant risk of neurological worsening, death and care requires constant monitoring of vital signs, hemodynamics,respiratory and cardiac monitoring, neurological assessment, discussion with family, other specialists and medical decision making of high complexity. I spent 45 minutes of neurocritical care time  in the care of  this patient.  Ritta SlotMcNeill Labarron Durnin, MD [facility] [phone]  If 7pm- 7am, please page neurology on call as listed in AMION. 10/18/2017  8:52 AM

## 2017-10-18 NOTE — Progress Notes (Signed)
Nutrition Follow-up  DOCUMENTATION CODES:   Obesity unspecified  INTERVENTION:   Tube Feeding:  Vital High Protein @ 15 ml/hr Pro-Stat 60 mL QID Provides 152 g of protein, 1160 kcals, 302 mL of free water. Meets 100% estimated protein needs; does not meet calorie needs but additional calories from propofol  TF regimen and propofol at current rate providing 2554 total kcal/day (123 % of kcal needs)  NUTRITION DIAGNOSIS:   Inadequate oral intake related to inability to eat as evidenced by NPO status.  Being addressed via TF   GOAL:   Provide needs based on ASPEN/SCCM guidelines  MONITOR:   Vent status, I & O's, TF tolerance, Labs, Weight trends  REASON FOR ASSESSMENT:   Ventilator    ASSESSMENT:   47 year old male with PMH of DM and smoking. Presented from home 3/5 after cardiac arrest.  Patient is currently intubated on ventilator support, currently on levophed MV: 8.7 L/min Temp (24hrs), Avg:98.2 F (36.8 C), Min:95.5 F (35.3 C), Max:99.3 F (37.4 C)  Pt currently requiring high dose of propofol; increased rate for seizure activity per RN  Propofol: 52.8 ml/hr Fentanyl/versed  Weight down since admission, net negative fluid balance since admission per I/O flowsheet but unsure of accuracy of admission weight.  Use estimated "dry weight" of 93.4 kg for estimating nutritional needs  No documented BM, abdomen soft, BS present OG tube   Diet Order:  Diet NPO time specified  EDUCATION NEEDS:   Not appropriate for education at this time  Skin:  Skin Assessment: Reviewed RN Assessment  Last BM:  no documented BM  Height:   Ht Readings from Last 1 Encounters:  10/18/2017 5' 11.5" (1.816 m)    Weight:   Wt Readings from Last 1 Encounters:  10/18/17 218 lb 11.1 oz (99.2 kg)    Ideal Body Weight:  79.5 kg  BMI:  Body mass index is 30.08 kg/m.  Estimated Nutritional Needs:   Kcal:  2072 kcals  Protein:  140-187 g  Fluid:  >/= 2 L   Romelle Starcherate  Shanah Guimaraes MS, RD, LDN, CNSC [phone] Pager  [phone] Weekend/On-Call Pager

## 2017-10-19 ENCOUNTER — Inpatient Hospital Stay (HOSPITAL_COMMUNITY): Payer: BC Managed Care – PPO

## 2017-10-19 DIAGNOSIS — A419 Sepsis, unspecified organism: Secondary | ICD-10-CM

## 2017-10-19 DIAGNOSIS — J690 Pneumonitis due to inhalation of food and vomit: Secondary | ICD-10-CM

## 2017-10-19 DIAGNOSIS — J9601 Acute respiratory failure with hypoxia: Secondary | ICD-10-CM

## 2017-10-19 DIAGNOSIS — J80 Acute respiratory distress syndrome: Secondary | ICD-10-CM

## 2017-10-19 DIAGNOSIS — R6521 Severe sepsis with septic shock: Secondary | ICD-10-CM

## 2017-10-19 LAB — BLOOD GAS, ARTERIAL
ACID-BASE DEFICIT: 14.5 mmol/L — AB (ref 0.0–2.0)
Acid-base deficit: 10.7 mmol/L — ABNORMAL HIGH (ref 0.0–2.0)
Bicarbonate: 17.7 mmol/L — ABNORMAL LOW (ref 20.0–28.0)
Bicarbonate: 14.5 mmol/L — ABNORMAL LOW (ref 20.0–28.0)
DRAWN BY: 252031
FIO2: 100
FIO2: 100
LHR: 34 {breaths}/min
MECHVT: 550 mL
MECHVT: 550 mL
O2 SAT: 99.3 %
O2 SAT: 98.7 %
PEEP/CPAP: 18 cmH2O
PEEP: 5 cmH2O
PH ART: 7.079 — AB (ref 7.350–7.450)
PH ART: 7.029 — AB (ref 7.350–7.450)
PO2 ART: 355 mmHg — AB (ref 83.0–108.0)
Patient temperature: 98.6
Patient temperature: 98.6
RATE: 34 resp/min
pCO2 arterial: 62.7 mmHg — ABNORMAL HIGH (ref 32.0–48.0)
pCO2 arterial: 57.5 mmHg — ABNORMAL HIGH (ref 32.0–48.0)
pO2, Arterial: 415 mmHg — ABNORMAL HIGH (ref 83.0–108.0)

## 2017-10-19 LAB — POCT I-STAT 3, ART BLOOD GAS (G3+)
ACID-BASE DEFICIT: 8 mmol/L — AB (ref 0.0–2.0)
ACID-BASE DEFICIT: 12 mmol/L — AB (ref 0.0–2.0)
ACID-BASE DEFICIT: 22 mmol/L — AB (ref 0.0–2.0)
Acid-base deficit: 7 mmol/L — ABNORMAL HIGH (ref 0.0–2.0)
BICARBONATE: 22.4 mmol/L (ref 20.0–28.0)
BICARBONATE: 18.7 mmol/L — AB (ref 20.0–28.0)
BICARBONATE: 9.8 mmol/L — AB (ref 20.0–28.0)
Bicarbonate: 21.9 mmol/L (ref 20.0–28.0)
O2 SAT: 63 %
O2 Saturation: 74 %
O2 Saturation: 70 %
O2 Saturation: 100 %
PCO2 ART: 60.9 mmHg — AB (ref 32.0–48.0)
PH ART: 7.163 — AB (ref 7.350–7.450)
PO2 ART: 51 mmHg — AB (ref 83.0–108.0)
PO2 ART: 54 mmHg — AB (ref 83.0–108.0)
Patient temperature: 36.9
Patient temperature: 37.3
Patient temperature: 37.2
TCO2: 24 mmol/L (ref 22–32)
TCO2: 24 mmol/L (ref 22–32)
TCO2: 21 mmol/L — ABNORMAL LOW (ref 22–32)
TCO2: 11 mmol/L — AB (ref 22–32)
pCO2 arterial: 70.8 mmHg (ref 32.0–48.0)
pCO2 arterial: 68 mmHg (ref 32.0–48.0)
pCO2 arterial: 49.7 mmHg — ABNORMAL HIGH (ref 32.0–48.0)
pH, Arterial: 7.11 — CL (ref 7.350–7.450)
pH, Arterial: 7.049 — CL (ref 7.350–7.450)
pH, Arterial: 6.896 — CL (ref 7.350–7.450)
pO2, Arterial: 46 mmHg — ABNORMAL LOW (ref 83.0–108.0)
pO2, Arterial: 413 mmHg — ABNORMAL HIGH (ref 83.0–108.0)

## 2017-10-19 LAB — COMPREHENSIVE METABOLIC PANEL
ALK PHOS: 42 U/L (ref 38–126)
ALT: 31 U/L (ref 17–63)
AST: 96 U/L — AB (ref 15–41)
Albumin: 3 g/dL — ABNORMAL LOW (ref 3.5–5.0)
Anion gap: 33 — ABNORMAL HIGH (ref 5–15)
BILIRUBIN TOTAL: 2.3 mg/dL — AB (ref 0.3–1.2)
BUN: 18 mg/dL (ref 6–20)
CALCIUM: 7 mg/dL — AB (ref 8.9–10.3)
CO2: 11 mmol/L — ABNORMAL LOW (ref 22–32)
Chloride: 95 mmol/L — ABNORMAL LOW (ref 101–111)
Creatinine, Ser: 2.34 mg/dL — ABNORMAL HIGH (ref 0.61–1.24)
GFR calc Af Amer: 36 mL/min — ABNORMAL LOW (ref >60)
GFR, EST NON AFRICAN AMERICAN: 31 mL/min — AB (ref >60)
GLUCOSE: 178 mg/dL — AB (ref 65–99)
POTASSIUM: 3.6 mmol/L (ref 3.5–5.1)
Sodium: 139 mmol/L (ref 135–145)
TOTAL PROTEIN: 4.4 g/dL — AB (ref 6.5–8.1)

## 2017-10-19 LAB — VALPROIC ACID LEVEL: VALPROIC ACID LVL: 39 ug/mL — AB (ref 50.0–100.0)

## 2017-10-19 LAB — POCT I-STAT, CHEM 8
BUN: 19 mg/dL (ref 6–20)
CREATININE: 2.5 mg/dL — AB (ref 0.61–1.24)
Calcium, Ion: 0.89 mmol/L — CL (ref 1.15–1.40)
Chloride: 96 mmol/L — ABNORMAL LOW (ref 101–111)
GLUCOSE: 167 mg/dL — AB (ref 65–99)
HEMATOCRIT: 23 % — AB (ref 39.0–52.0)
Hemoglobin: 7.8 g/dL — ABNORMAL LOW (ref 13.0–17.0)
POTASSIUM: 4.3 mmol/L (ref 3.5–5.1)
Sodium: 134 mmol/L — ABNORMAL LOW (ref 135–145)
TCO2: 13 mmol/L — AB (ref 22–32)

## 2017-10-19 LAB — PROCALCITONIN: Procalcitonin: 29.26 ng/mL

## 2017-10-19 LAB — LACTIC ACID, PLASMA
Lactic Acid, Venous: 7.9 mmol/L (ref 0.5–1.9)
Lactic Acid, Venous: 12.9 mmol/L (ref 0.5–1.9)
Lactic Acid, Venous: 15.5 mmol/L (ref 0.5–1.9)

## 2017-10-19 LAB — MAGNESIUM
MAGNESIUM: 2 mg/dL (ref 1.7–2.4)
MAGNESIUM: 2.1 mg/dL (ref 1.7–2.4)

## 2017-10-19 LAB — CBC
HCT: 30.4 % — ABNORMAL LOW (ref 39.0–52.0)
HEMATOCRIT: 24 % — AB (ref 39.0–52.0)
HEMOGLOBIN: 7.8 g/dL — AB (ref 13.0–17.0)
Hemoglobin: 9.7 g/dL — ABNORMAL LOW (ref 13.0–17.0)
MCH: 29.9 pg (ref 26.0–34.0)
MCH: 30.7 pg (ref 26.0–34.0)
MCHC: 31.9 g/dL (ref 30.0–36.0)
MCHC: 32.5 g/dL (ref 30.0–36.0)
MCV: 93.8 fL (ref 78.0–100.0)
MCV: 94.5 fL (ref 78.0–100.0)
PLATELETS: 207 10*3/uL (ref 150–400)
Platelets: 130 10*3/uL — ABNORMAL LOW (ref 150–400)
RBC: 3.24 MIL/uL — AB (ref 4.22–5.81)
RBC: 2.54 MIL/uL — ABNORMAL LOW (ref 4.22–5.81)
RDW: 14.4 % (ref 11.5–15.5)
RDW: 14.3 % (ref 11.5–15.5)
WBC: 2.4 10*3/uL — AB (ref 4.0–10.5)
WBC: 1.8 10*3/uL — ABNORMAL LOW (ref 4.0–10.5)

## 2017-10-19 LAB — PROTIME-INR
INR: 2.38
Prothrombin Time: 25.8 seconds — ABNORMAL HIGH (ref 11.4–15.2)

## 2017-10-19 LAB — BASIC METABOLIC PANEL
ANION GAP: 26 — AB (ref 5–15)
BUN: 15 mg/dL (ref 6–20)
CO2: 14 mmol/L — ABNORMAL LOW (ref 22–32)
Calcium: 7.6 mg/dL — ABNORMAL LOW (ref 8.9–10.3)
Chloride: 98 mmol/L — ABNORMAL LOW (ref 101–111)
Creatinine, Ser: 2.07 mg/dL — ABNORMAL HIGH (ref 0.61–1.24)
GFR calc Af Amer: 42 mL/min — ABNORMAL LOW (ref >60)
GFR, EST NON AFRICAN AMERICAN: 36 mL/min — AB (ref >60)
GLUCOSE: 84 mg/dL (ref 65–99)
POTASSIUM: 3.1 mmol/L — AB (ref 3.5–5.1)
Sodium: 138 mmol/L (ref 135–145)

## 2017-10-19 LAB — PHOSPHORUS: PHOSPHORUS: 7.8 mg/dL — AB (ref 2.5–4.6)

## 2017-10-19 LAB — GLUCOSE, CAPILLARY
GLUCOSE-CAPILLARY: 76 mg/dL (ref 65–99)
GLUCOSE-CAPILLARY: 25 mg/dL — AB (ref 65–99)
Glucose-Capillary: 71 mg/dL (ref 65–99)

## 2017-10-19 LAB — APTT: aPTT: 116 seconds — ABNORMAL HIGH (ref 24–36)

## 2017-10-19 LAB — TROPONIN I: TROPONIN I: 21.84 ng/mL — AB (ref <0.03)

## 2017-10-19 LAB — FIBRINOGEN: Fibrinogen: 268 mg/dL (ref 210–475)

## 2017-10-19 MED ORDER — VALPROATE SODIUM 500 MG/5ML IV SOLN
1000.0000 mg | Freq: Once | INTRAVENOUS | Status: AC
  Administered 2017-10-19: 1000 mg via INTRAVENOUS
  Filled 2017-10-19: qty 10

## 2017-10-19 MED ORDER — SODIUM BICARBONATE 8.4 % IV SOLN
25.0000 meq | Freq: Once | INTRAVENOUS | Status: AC
  Administered 2017-10-19: 25 meq via INTRAVENOUS

## 2017-10-19 MED ORDER — ALBUMIN HUMAN 5 % IV SOLN
12.5000 g | Freq: Once | INTRAVENOUS | Status: AC
  Administered 2017-10-19: 12.5 g via INTRAVENOUS
  Filled 2017-10-19: qty 250

## 2017-10-19 MED ORDER — EPINEPHRINE PF 1 MG/ML IJ SOLN
0.5000 ug/min | INTRAVENOUS | Status: DC
  Administered 2017-10-19 (×2): 15 ug/min via INTRAVENOUS
  Filled 2017-10-19 (×4): qty 4

## 2017-10-19 MED ORDER — FENTANYL BOLUS VIA INFUSION
50.0000 ug | INTRAVENOUS | Status: DC | PRN
  Filled 2017-10-19: qty 50

## 2017-10-19 MED ORDER — DEXTROSE 50 % IV SOLN
INTRAVENOUS | Status: AC
  Administered 2017-10-19: 50 mL
  Filled 2017-10-19: qty 50

## 2017-10-19 MED ORDER — DEXTROSE 5 % IV SOLN
INTRAVENOUS | Status: DC
  Administered 2017-10-19: 06:00:00 via INTRAVENOUS
  Filled 2017-10-19 (×2): qty 150

## 2017-10-19 MED ORDER — SODIUM CHLORIDE 0.9 % IV SOLN
Freq: Four times a day (QID) | INTRAVENOUS | Status: DC
  Administered 2017-10-19: 10:00:00 via INTRAVENOUS
  Filled 2017-10-19 (×2): qty 100

## 2017-10-19 MED ORDER — SODIUM BICARBONATE 8.4 % IV SOLN
50.0000 meq | Freq: Once | INTRAVENOUS | Status: AC
  Administered 2017-10-19: 50 meq via INTRAVENOUS

## 2017-10-19 MED ORDER — SODIUM BICARBONATE 8.4 % IV SOLN
100.0000 meq | Freq: Once | INTRAVENOUS | Status: AC
  Administered 2017-10-19: 100 meq via INTRAVENOUS

## 2017-10-19 MED ORDER — EPINEPHRINE PF 1 MG/10ML IJ SOSY
PREFILLED_SYRINGE | INTRAMUSCULAR | Status: AC
  Administered 2017-10-19: 1 mg
  Filled 2017-10-19: qty 10

## 2017-10-19 MED ORDER — SODIUM BICARBONATE 8.4 % IV SOLN
INTRAVENOUS | Status: DC
  Administered 2017-10-19: 11:00:00 via INTRAVENOUS
  Filled 2017-10-19 (×2): qty 50

## 2017-10-19 MED ORDER — SODIUM CHLORIDE 0.9 % IV SOLN
1.0000 g | Freq: Three times a day (TID) | INTRAVENOUS | Status: DC
  Administered 2017-10-19: 1 g via INTRAVENOUS
  Filled 2017-10-19 (×2): qty 1

## 2017-10-19 MED ORDER — THIAMINE HCL 100 MG/ML IJ SOLN
Freq: Two times a day (BID) | INTRAVENOUS | Status: DC
  Administered 2017-10-19: 10:00:00 via INTRAVENOUS
  Filled 2017-10-19: qty 50

## 2017-10-19 MED ORDER — CISATRACURIUM BOLUS VIA INFUSION
0.1000 mg/kg | Freq: Once | INTRAVENOUS | Status: AC
  Administered 2017-10-19: 9.9 mg via INTRAVENOUS
  Filled 2017-10-19: qty 10

## 2017-10-19 MED ORDER — ARTIFICIAL TEARS OPHTHALMIC OINT
1.0000 "application " | TOPICAL_OINTMENT | Freq: Three times a day (TID) | OPHTHALMIC | Status: DC
  Administered 2017-10-19: 1 via OPHTHALMIC
  Filled 2017-10-19: qty 3.5

## 2017-10-19 MED ORDER — SODIUM CHLORIDE 0.9 % FOR CRRT
INTRAVENOUS_CENTRAL | Status: DC | PRN
  Filled 2017-10-19: qty 1000

## 2017-10-19 MED ORDER — PRISMASOL BGK 4/2.5 32-4-2.5 MEQ/L IV SOLN
INTRAVENOUS | Status: DC
  Filled 2017-10-19 (×2): qty 5000

## 2017-10-19 MED ORDER — PRISMASOL BGK 4/2.5 32-4-2.5 MEQ/L IV SOLN
INTRAVENOUS | Status: DC
  Filled 2017-10-19: qty 5000

## 2017-10-19 MED ORDER — SODIUM CHLORIDE 0.9 % IV SOLN
0.0000 ug/min | INTRAVENOUS | Status: DC
  Administered 2017-10-19 (×3): 320 ug/min via INTRAVENOUS
  Filled 2017-10-19 (×2): qty 4
  Filled 2017-10-19 (×2): qty 40
  Filled 2017-10-19: qty 4

## 2017-10-19 MED ORDER — VANCOMYCIN HCL IN DEXTROSE 1-5 GM/200ML-% IV SOLN
1000.0000 mg | Freq: Three times a day (TID) | INTRAVENOUS | Status: DC
  Filled 2017-10-19: qty 200

## 2017-10-19 MED ORDER — DEXTROSE 5 % IV SOLN: 0.5000 ug/min | INTRAVENOUS | Status: DC

## 2017-10-19 MED ORDER — HEPARIN SODIUM (PORCINE) 5000 UNIT/ML IJ SOLN: 5000.0000 [IU] | Freq: Three times a day (TID) | INTRAMUSCULAR | Status: DC

## 2017-10-19 MED ORDER — HEPARIN SODIUM (PORCINE) 1000 UNIT/ML DIALYSIS
1000.0000 [IU] | INTRAMUSCULAR | Status: DC | PRN
  Filled 2017-10-19: qty 6

## 2017-10-19 MED ORDER — HYDROCORTISONE NA SUCCINATE PF 100 MG IJ SOLR
50.0000 mg | Freq: Four times a day (QID) | INTRAMUSCULAR | Status: DC
  Administered 2017-10-19 (×2): 50 mg via INTRAVENOUS
  Filled 2017-10-19 (×2): qty 2

## 2017-10-19 MED ORDER — ALBUMIN HUMAN 25 % IV SOLN
50.0000 g | INTRAVENOUS | Status: AC
  Administered 2017-10-19: 50 g via INTRAVENOUS
  Administered 2017-10-19: 12.5 g via INTRAVENOUS
  Administered 2017-10-19: 50 g via INTRAVENOUS
  Filled 2017-10-19: qty 100
  Filled 2017-10-19: qty 200
  Filled 2017-10-19 (×3): qty 50
  Filled 2017-10-19: qty 200

## 2017-10-19 MED ORDER — VANCOMYCIN HCL 10 G IV SOLR
2000.0000 mg | Freq: Once | INTRAVENOUS | Status: AC
  Administered 2017-10-19: 2000 mg via INTRAVENOUS
  Filled 2017-10-19: qty 2000

## 2017-10-19 MED ORDER — SODIUM CHLORIDE 0.9 % IV SOLN: 1.0000 g | Freq: Two times a day (BID) | INTRAVENOUS | Status: DC

## 2017-10-19 MED ORDER — STERILE WATER FOR INJECTION IV SOLN
INTRAVENOUS | Status: DC
  Administered 2017-10-19: 01:00:00 via INTRAVENOUS
  Filled 2017-10-19 (×2): qty 9.71

## 2017-10-19 MED ORDER — SODIUM CHLORIDE 0.9 % IV SOLN
3.0000 ug/kg/min | INTRAVENOUS | Status: DC
  Administered 2017-10-19: 3 ug/kg/min via INTRAVENOUS
  Administered 2017-10-19: 2 ug/kg/min via INTRAVENOUS
  Filled 2017-10-19 (×2): qty 20

## 2017-10-19 MED ORDER — VALPROATE SODIUM 500 MG/5ML IV SOLN
1000.0000 mg | Freq: Three times a day (TID) | INTRAVENOUS | Status: DC
  Filled 2017-10-19: qty 10

## 2017-10-19 MED ORDER — SODIUM BICARBONATE 8.4 % IV SOLN
INTRAVENOUS | Status: AC
  Filled 2017-10-19: qty 50

## 2017-10-19 MED FILL — Medication: Qty: 1 | Status: AC

## 2017-10-21 LAB — CULTURE, BLOOD (ROUTINE X 2): SPECIAL REQUESTS: ADEQUATE

## 2017-10-22 LAB — CULTURE, BLOOD (ROUTINE X 2): SPECIAL REQUESTS: ADEQUATE

## 2017-11-02 ENCOUNTER — Telehealth: Payer: Self-pay

## 2017-11-02 NOTE — Telephone Encounter (Signed)
On 11/02/17 [facility] The d/c is for burial. The patient is a patient of Siva Sivakumar. [facility].  On 11/06/17 I received the d/c back form Doctor Craige CottaSood who signed the d/c for Doctor Sivakumar. I got the d/c ready and called the funeral home to let them know I mailed the d/c to vital records per the funeral home request.

## 2017-11-12 NOTE — Progress Notes (Signed)
CRITICAL VALUE ALERT  Critical Value:  7.9  Date & Time Notied:  10/12/2017 @ 0324  Provider Notified: Joneen RoachPaul Hoffman, NP CCM  Orders Received/Actions taken: Continue with current plan of care

## 2017-11-12 NOTE — Progress Notes (Addendum)
PCCM INTERVAL PROGRESS NOTE  [facility] with worsening hypoxia, acidosis, and shock. So far this evening we have added deep sedation, pressors, and ventilator changes in hope to correct this. Prone positioning has been discussed as well. On my evaluation patient on 100% FiO2, rate of 24, and 15cmH2O PEEP.   ABG    Component Value Date/Time   PHART 7.163 (LL) 10/18/2017 2357   PCO2ART 60.9 (H) 10/18/2017 2357   PO2ART 51.0 (L) 10/18/2017 2357   HCO3 21.9 10/18/2017 2357   TCO2 24 10/18/2017 2357   ACIDBASEDEF 7.0 (H) 10/18/2017 2357   O2SAT 74.0 10/18/2017 2357    CXR : dense bibasilar consolidation.   Plan: Increased vent rate to 35 from 24, however, significant autoPEEP. Rate decreased to 30. AutoPEEP improved PEEP increased to 18 from 15, no clear improvement in O2 sat. Will initiate neuromuscular blockade Will hold off on prone positioning for now, suspect we will still need to do this, but will try NMB first BIS and TO4 monitoring.  DC bicarb infusion Check lactic/procal Check cultures Start ABX  Joneen RoachPaul Hoffman, AGACNP-BC [place] [facility] Pager [phone] or [phone]  10/28/2017 1:21 AM

## 2017-11-12 NOTE — Progress Notes (Addendum)
[facility] to [facility] this [facility] for [facility]. Briefly this is a 46yoM admitted after cardiac arrest related to ACS, s/p cooling now being rewarmed, in status epilepticus, with worsening acute hypoxic respiratory failure with bilateral pulmonary infiltrates (L>R) that have worsened over the past day. My APP had already examined patient, appropriately recognized acute respiratory acidosis and increased RR from 24 to 30, also increased PEEP from 15 to 18 for hypoxia.   On exam: BP 93/39, HR 75, RR 30, Pox 71% Vent: PRVC 100% FIO2, RR 30 (turned up from 24 by my NP Alexander RoachPaul Torres), PEEP 18. Sedation: fentanyl @ 300mcg, Versed @ 30mg , Propofol @ 40mcg (on propofol for burst suppression of seizures) Other infusions: Amiodarone gtt, 1/4NS with 50MEQ Bicarb @ 75cc/hr  Patient deeply sedated no response to sternal rub Lungs CTA b/l Creamy white secretions from ETT 1+ BLE edema   CXR on my review shows significant bilateral infiltrates (L>R) that have progressed over the past day. Also ETT on CXR is a bit high, slightly above heads of clavicles. OG tube also high, terminating in the esophagus.   ABG (3/8 @ 0100): 7.11/70/46/22/63%  BMP significant for K 3.4 and Mg 1.6  A/P: Patient has Acute hypoxic respiratory failure with ARDS due to Aspiration pneumonia, with concurrent shock (septic vs cardiogenic +/- component of iatrogenic from sedation). - Advanced ETT to 27cm at the teeth and resecured it. Advanced OG tube and resecured it. Repeat CXR shows ETT and OG tube both now in good position - ABG shows severe RESPIRATORY acidosis, therefore bicarb infusion is of no help and just adds to patient's already overloaded status. Will stop this now. - Clinically has pneumonia based on purulent secretions from ETT and impressive pulmonary infiltrates. Obtain blood cultures, sputum cultures, lactate, procalcitonin. Start Vanc and Meropenem.  -  start cisatracurium gtt in order to paralyze patient, after which plan to prone him. Plan for 16 hours prone followed by 8hrs supine.  - repeat ABG in 1 hour.  - shock: unclear if purely septic vs component of cardiogenic (EF 35% on TTE with hypokinesis of inferolateral wall) vs component of iatrogenic shock from sedation (on high dose propofol for his seizures). Is currently requiring 2 vasopressors so will add stress dose steroids. CVP monitoring would be inaccurate at this point given how much PEEP he is on. Question if IABP may be of use if pressure continues to drop. Have spoken with Dr Wilford CornerArora of Neurology regarding how much longer we plan to burst suppress the patient and if we can come down on the propofol infusion any, which would help his BP. He says there have been no seizures since 3/7 AM and that we can decrease the propofol from 40mg  to 20mg  now. Then if he tolerates it well we can slowly come down on the versed gtt.  - he remains critically ill with multiorgan system dysfunction and a high chance of deterioration and death.   60 minutes nonprocedural critical care time  Alexander ObeyKathleen Hammonds, MD Pulmonary & Critical Care Medicine Pager: [phone]

## 2017-11-12 NOTE — Progress Notes (Signed)
eLink Physician-Brief Progress Note Patient Name: Alexander Torres DOB: 05-Aug-1971 MRN: [id number]   Date of Service  10/15/2017  HPI/Events of Note  Patient with profound hypoxemia and hypotension.   eICU Interventions  Reduce infusion rate of propofol, add Vasopressin, NaHCO3 for acidosis , PCV ventilation with I:E of 1:2 trialed without improvement. [facility] 15 cm, prone ventilation for intractable hypoxemia, family updated regarding poor prognosis, full code remains in place.        Thomasene Lotkoronkwo U Laressa Bolinger 11/03/2017, 12:49 AM

## 2017-11-12 NOTE — Progress Notes (Signed)
Patient was turned prone at approx 3:40am. Pox on monitor already improved. However most recent ABG (at 3am which was prior to proning) shows continued respiratory acidosis but worsening metabolic acidosis (Bicarb falling from 22 to 18). Do not want to restart bicarb gtt because giving more overall volume will worsen his oxygenation. Will instead give 2 amps Bicarb push now. Worsening shock on max Levo and Vaso; adding Epi now.   Repeat ABG at 4:40am which will be after 1 hour proned.

## 2017-11-12 NOTE — Progress Notes (Signed)
Pharmacy Antibiotic Note  Alexander Torres is a 47 y.o. male admitted on 11/02/2017 s/p cardiac arrest, now w/ concern for pneumonia and sepsis.  Pharmacy has been consulted for vancomycin and meropenem dosing.  Plan: Vancomycin 2000mg x1 then 1000mg  IV every 8 hours.  Goal trough 15-20 mcg/mL.  Merrem 1g IV every 8 hours.  Height: 5' 11.5" (181.6 cm) Weight: 218 lb 11.1 oz (99.2 kg) IBW/kg (Calculated) : 76.45  Temp (24hrs), Avg:98.3 F (36.8 C), Min:97.1 F (36.2 C), Max:99.3 F (37.4 C)  Recent Labs  Lab 10/24/2017 0635 10/30/2017 0947 10/22/2017 2112 10/17/17 0412 10/17/17 1013 10/18/17 0354 10/18/17 2249  WBC 30.0*  --   --  9.7  --  9.7  --   CREATININE  --  0.83 0.63  --  0.49* 0.56* 1.00    Estimated Creatinine Clearance: 110.6 mL/min (by C-G formula based on SCr of 1 mg/dL).    Allergies  Allergen Reactions  . Penicillins Hives     Thank you for allowing pharmacy to be a part of this patient's care.  Vernard GamblesVeronda Teresina Bugaj, PharmD, BCPS  10/22/2017 2:15 AM

## 2017-11-12 NOTE — Progress Notes (Signed)
Family  meeting necessary as patient is In critical condition due to -       . Septic shock,  AKI met acidosis, ARDS, MI, NON CONVULSIVE STATUS, ACUTE RESP FAILURE.    Need to make critical decisions regarding procedures,treatment options.  Discussed with-FAMILY-   1. SISTER- THERESA RAKESTRAW 2. Willard 3. STEP SON- BILLY MCGHEE 4. BRO-IN-LAW & SIS-IN-LAW- MIKE & ROBYN Katen 5. WIFE- CHARLENE Saperstein 6. DAUGHTER-BRITTANYMCGHEE   EXPLAINED ALL CRITICAL CONDITIONS, DETERIORATION AND STABILIZATION TILL NOW,  CONTINUING AGGRESSIVE CARE AT PRESENT,  WAITFUL WATCHING AND MANAGING COMPLICATIONS. HOWEVER FURTHER RISK OF DETERIORATION IS POSSIBLE.  THEY UNDERSTAND.  THEY ARE THANKFUL TO THE ICU TEAM FOR CARING FOR THIS PATIENT.   Time  spent  30     Minutes.     Evans Lance, MD Pulmonary and [place] Pager: [phone]

## 2017-11-12 NOTE — Progress Notes (Addendum)
CRITICAL CARE PROGRESS NOTE   ICU day #  4  Hospital day  4   Mechanical Ventilation day # 4  Patient Summary:    Name: Alexander Torres MRN:   [id number] DOB:   1971/07/22          ADMISSION DATE:  10/13/2017 CONSULTATION DATE:  10/17/2017 REFERRING MD:  Dr. Angelena Form CHIEF COMPLAINT:  Cardiac Arrest HISTORY OF PRESENT ILLNESS:   HPI obtained from medical chart review and from patient's wife in waiting room as patient is intubated and mechanically intubated.    47 year old male with past medical history significant for Diabetes and smoking presented from home 3/5 after cardiac arrest. Wife reports patient has been complaining of intermittent left arm pain for the last 3 days.  Denied any recent illness, complaints of chest pain or shortness of breath.   Wife reports strong heart disease on patient's father side. The pain woke him up this morning.  Wife talked with him then heard him collapse about 10 mins later and found him on couch unresponsive.  She started CPR per 911 instructions.  He was found in ventricular fibrilliation by EMS, defibrillated x 1 with 4 mins of CPR prior to ROSC.  Unclear total downtime.  He was given narcan without response by the [facility] department and again with EMS.  He remained agonal and unresponsive requiring King airway.  Inferior STEMI noted on EKG.  On arrival to ER, [name] airway exchanged and then taken to the cath lab by Cardiology.  Blood pressure stable and noted to be in Afib with RVR. In cath lab he was found to have severe diffuse coronary disease. LCx was revascularized. During reperfusion he suffered several runs of VT requiring 6 shocks, but no additional CPR.  He remained unresponsive and therefore cooling protocol started.    PAST MEDICAL HISTORY :  He  has a past medical history of Diabetes mellitus ([place]). Tobacco abuse. PAST SURGICAL HISTORY: He  has a past surgical history that includes Coronary/Graft Acute MI Revascularization (N/A, 11/06/2017)  and LEFT HEART CATH AND CORONARY ANGIOGRAPHY (N/A, 10/21/2017).   New Events  3/6 On hypothermia protocol>TTM - 35C No szr on EEG (CONT) No myoclonus No secretions Good urine outpt No further arrythmias- on IV amiodarone   3/7 Reportedly had been unresponsive yesterday evening.  He showed nonconvulsive status activity. Neurology modifying antiepileptic therapy. Minimal endotracheal secretions.  No fever.   3/8 Earlier in the morning- [facility] actively worked on decompensated pt from aspiration pneumonia, hypoxia, septic shock-> on Hi dose Epi, Levo, + Vaso, Peep 18, 100% fi02, PRONED; Improved oxygenation, peripheral cyanosis; AKI developed. Oliguric/anuric  Family well aware of deterioration; EEG showed burst suppression- medically induced per Neurology, seizures controlled with propofol versed, lincosamide, depk, keppra. Bleeding from mouth noted.  RN HAD ADVANCED  Feeding tube YESTERDAY BEFORE STARTING FEEDS. RPT XRAY still showed tube in distal esophagus after which it was pushed down into stomach.   Scheduled Meds: . artificial tears  1 application Both Eyes Y1E  . aspirin  81 mg Oral Daily  . atorvastatin  80 mg Oral q1800  . chlorhexidine gluconate (MEDLINE KIT)  15 mL Mouth Rinse BID  . Chlorhexidine Gluconate Cloth  6 each Topical Daily  . docusate  100 mg Per Tube BID  . famotidine  20 mg Per Tube BID  . feeding supplement (PRO-STAT SUGAR FREE 64)  60 mL Per Tube QID  . feeding supplement (VITAL HIGH PROTEIN)  1,000 mL  Per Tube Q24H  . fentaNYL (SUBLIMAZE) injection  100 mcg Intravenous Once  . heparin injection (subcutaneous)  5,000 Units Subcutaneous Q8H  . hydrocortisone sod succinate (SOLU-CORTEF) inj  50 mg Intravenous Q6H  . insulin aspart  1-3 Units Subcutaneous Q4H  . mouth rinse  15 mL Mouth Rinse 10 times per day  . sennosides  5 mL Per Tube QHS  . sodium chloride flush  10-40 mL Intracatheter Q12H  . ticagrelor  90 mg Oral BID   Continuous  Infusions: . sodium chloride    . sodium chloride    . sodium chloride 10 mL/hr at 25-Oct-2017 0900  . albumin human 12.5 g (2017/10/25 0821)  . amiodarone 30 mg/hr (2017/10/25 0900)  . cisatracurium (NIMBEX) infusion 3.007 mcg/kg/min (2017/10/25 0900)  . epinephrine 15.013 mcg/min (10-25-17 0900)  . fentaNYL infusion INTRAVENOUS 300 mcg/hr (10-25-17 0900)  . lacosamide (VIMPAT) IV Stopped (Oct 25, 2017 0027)  . levETIRAcetam Stopped (10-25-17 0855)  . meropenem (MERREM) IV Stopped (10/25/17 0530)  . midazolam (VERSED) infusion 20 mg/hr (2017-10-25 0900)  . norepinephrine (LEVOPHED) Adult infusion 80 mcg/min (10-25-17 0900)  . phenylephrine (NEO-SYNEPHRINE) Adult infusion 320 mcg/min (2017/10/25 0900)  . propofol (DIPRIVAN) infusion 20 mcg/kg/min (10-25-17 0900)  .  sodium bicarbonate  infusion 1000 mL 150 mL/hr at 10/25/2017 0900  . sodium chloride 0.9 % 100 mL with ascorbic acid 1,500 mg infusion    . sodium chloride 0.9 % 50 mL with thiamine (B-1) 200 mg infusion    . valproate sodium 750 mg (10-25-2017 0646)  . vancomycin    . vasopressin (PITRESSIN) infusion - *FOR SHOCK* 0.03 Units/min (October 25, 2017 0900)   PRN Meds:.sodium chloride, sodium chloride, acetaminophen **OR** acetaminophen (TYLENOL) oral liquid 160 mg/5 mL, albuterol, fentaNYL, ondansetron (ZOFRAN) IV, sodium chloride flush  Today's Vitals   October 25, 2017 0700 10-25-17 0730 Oct 25, 2017 0830 10/25/2017 0900  BP: (!) 93/59 (!) 96/44 (!) 88/54 (!) 89/45  Pulse: 79 78 71 72  Resp: (!) 34 (!) 34 (!) 30 (!) 34  Temp:    (!) 97.3 F (36.3 C)  TempSrc:      SpO2: (!) 75% (!) 75% (!) 70% (!) 89%  Weight:      Height:        Exam limited by PRONING  General:  Middle aged male on vent HEENT:   Pupils  Dilated, fixed, equal, fixed, Cardiovascular:  Tachy, regular, n s1 s2 - no murmur Lungs:  Coarse bilateral breath sounds. Vent supported breaths.  Abdomen:  Soft, non-distended, BS+ Musculoskeletal:  No acute deformity No edema Tone- paralyzed Skin:   peripheral cyanosis  Vent Mode: PRVC FiO2 (%):  [40 %-100 %] 100 % Set Rate:  [16 bmp-34 bmp] 34 bmp Vt Set:  [550 mL] 550 mL PEEP:  [5 cmH20-18 cmH20] 18 cmH20 Plateau Pressure:  [13 cmH20-35 cmH20] 29 cmH20  CT brain 3/6  1. No acute intracranial abnormalities.  Normal brain.   10/31/2017 ekg Atrial fibrillation Inferoposterior infarct, acute (RCA) Probable RV involvement, suggest recording right precordial leads  3/6 TTE - Left ventricle: The cavity size was normal. Wall thickness was   normal. Systolic function was moderately reduced. The estimated   ejection fraction was in the range of 35% to 40%. Severe   hypokinesis of the entireinferolateral myocardium; consistent   with ischemia or infarction in the distribution of the left   circumflex coronary artery. The study is not technically   sufficient to allow evaluation of LV diastolic function. Acoustic   contrast opacification revealed  no evidence ofthrombus. - Mitral valve: Calcified annulus. There was mild regurgitation. - Left atrium: The atrium was mildly dilated. - Right ventricle: Systolic function was mildly reduced.      18-Oct-2017 06:33:09 EKG   Normal sinus rhythm Nonspecific ST and T wave abnormality Prolonged QT Abnormal ECG  3/8/AXR Airspace disease at the left greater than right bases. Esophageal tube is looped upon itself in the left hemiabdomen with the tip directed cephalad at overlying the left diaphragmatic region. Gasless abdomen   chest X-ray 3/8 Endotracheal tube tip is about 4.4 cm superior to the carina. Left-sided central venous catheter tip overlies the SVC. Esophageal tube tip is below the diaphragm. No change in confluent airspace disease within the left mid to lower lung with patchy airspace disease in the right lung base. Stable cardiomediastinal silhouette.  Results for Alexander Torres, Alexander Torres (MRN [id number]) as of 11/02/17 09:44  Ref. Range 11-02-2017 04:00 11/02/2017 04:35 11/02/2017 04:45   pH, Arterial Latest Ref Range: 7.350 - 7.450  7.079 (LL)  7.029 (LL)  pCO2 arterial Latest Ref Range: 32.0 - 48.0 mmHg 62.7 (H)  57.5 (H)  pO2, Arterial Latest Ref Range: 83.0 - 108.0 mmHg 415 (H)  355 (H)  Acid-base deficit Latest Ref Range: 0.0 - 2.0 mmol/L 10.7 (H)  14.5 (H)  Bicarbonate Latest Ref Range: 20.0 - 28.0 mmol/L 17.7 (L)  14.5 (L)     Results for Alexander Torres, Alexander Torres (MRN [id number]) as of November 02, 2017 09:44  Ref. Range 02-Nov-2017 04:35 2017-11-02 04:45 2017/11/02 05:27  Sodium Latest Ref Range: 135 - 145 mmol/L 138    Potassium Latest Ref Range: 3.5 - 5.1 mmol/L 3.1 (L)    Chloride Latest Ref Range: 101 - 111 mmol/L 98 (L)    CO2 Latest Ref Range: 22 - 32 mmol/L 14 (L)    Glucose Latest Ref Range: 65 - 99 mg/dL 84    BUN Latest Ref Range: 6 - 20 mg/dL 15    Creatinine Latest Ref Range: 0.61 - 1.24 mg/dL 2.07 (H)    Calcium Latest Ref Range: 8.9 - 10.3 mg/dL 7.6 (L)    Anion gap Latest Ref Range: 5 - 15  26 (H)    Phosphorus Latest Ref Range: 2.5 - 4.6 mg/dL 7.8 (H)    Magnesium Latest Ref Range: 1.7 - 2.4 mg/dL 2.0    GFR, Est Non African American Latest Ref Range: >60 mL/min 36 (L)    GFR, Est African American Latest Ref Range: >60 mL/min 42 (L)    Lactic Acid, Venous Latest Ref Range: 0.5 - 1.9 mmol/L   12.9 (HH)     CBC Latest Ref Rng & Units 02-Nov-2017 10/18/2017 10/18/2017  WBC 4.0 - 10.5 K/uL 2.4(L) - 9.7  Hemoglobin 13.0 - 17.0 g/dL 9.7(L) 10.5(L) 10.7(L)  Hematocrit 39.0 - 52.0 % 30.4(L) 31.0(L) 31.8(L)  Platelets 150 - 400 K/uL 207 - 216   BMP Latest Ref Rng & Units 2017/11/02 10/18/2017 10/18/2017  Glucose 65 - 99 mg/dL 84 132(H) 126(H)  BUN 6 - 20 mg/dL 15 11 <5(L)  Creatinine 0.61 - 1.24 mg/dL 2.07(H) 1.00 0.56(L)  Sodium 135 - 145 mmol/L 138 133(L) 134(L)  Potassium 3.5 - 5.1 mmol/L 3.1(L) 3.4(L) 4.0  Chloride 101 - 111 mmol/L 98(L) 98(L) 102  CO2 22 - 32 mmol/L 14(L) - 22  Calcium 8.9 - 10.3 mg/dL 7.6(L) - 8.7(L)   Results for Alexander Torres, Alexander Torres (MRN [id number]) as of  Nov 02, 2017 09:44  Ref. Range 11/02/2017 04:35  WBC Latest Ref  Range: 4.0 - 10.5 K/uL 2.4 (L)  RBC Latest Ref Range: 4.22 - 5.81 MIL/uL 3.24 (L)  Hemoglobin Latest Ref Range: 13.0 - 17.0 g/dL 9.7 (L)  HCT Latest Ref Range: 39.0 - 52.0 % 30.4 (L)  MCV Latest Ref Range: 78.0 - 100.0 fL 93.8  MCH Latest Ref Range: 26.0 - 34.0 pg 29.9  MCHC Latest Ref Range: 30.0 - 36.0 g/dL 31.9  RDW Latest Ref Range: 11.5 - 15.5 % 14.4  Platelets Latest Ref Range: 150 - 400 K/uL 207  Valproic Acid,S Latest Ref Range: 50.0 - 100.0 ug/mL 39 (L)  Glucose Latest Ref Range: 65 - 99 mg/dL 84       Coronary/Graft Acute MI Revascularization  LEFT HEART CATH AND CORONARY ANGIOGRAPHY  10/29/2017   Ost Cx lesion is 100% stenosed.  Ost LAD to Prox LAD lesion is 65% stenosed.  Ost 1st Diag lesion is 90% stenosed.  Prox LAD lesion is 65% stenosed.  Ost RCA to Prox RCA lesion is 80% stenosed.  A drug-eluting stent was successfully placed using a STENT SYNERGY DES 3X24.  Post intervention, there is a 0% residual stenosis.  Mid Cx lesion is 90% stenosed.  Ost 2nd Mrg lesion is 70% stenosed.  1. Acute inferolateral STEMI secondary to proximal occlusion of the large, dominant Circumflex artery.  2. Successful PTCA/DES x 1 proximal Circumflex artery 3. Moderately severe stenosis in the proximal LAD and mid LAD 4. Residual stenosis in the mid AV groove Circumflex artery and ostium of the first OM branch.  5. Severe stenosis in the small, non-dominant RCA Recommendations: He will be admitted to the ICU. PCCM to admit and begin cooling protocol. I will continue IV amiodarone. The IV Cangrelor will be continued for now and will be stopped once his Brilinta loading dose has been given. Rectal ASA will be given now. High dose statin will be started. Echo today to assess LVEF. I will ask the CHF/Shock team to follow.  If he makes a full neurological recovery, will need repeat cardiac to consider PCI of the Circumflex and  LAD.   ASSESSMENT / PLAN:    46 Y/O DIABETIC MALE, SMOKER, S/P V FIB ARREST- ROSC < 5 MIN, POST LCX PCI /DES   MULTIVESSEL CAD  ACUTE RESPIRATORY FAILURE WITH MET/RESP ACIDOSIS   R 3,4 LATERAL RIB FXR SEC TO CPR- NO EVIDENCE OF BAROTRAUMA  AT RISK FOR ANOXIC ENCEPHALOPATHY  STRESS>>LEUKOCYTOSIS  HIGH LDL  AT RISK FOR SHOCK LIVER AND ATN/AKI  Hx Chronic neck pain  H/o DM, hyperlipidemia, tobacco use   3/7 OVERNIGHT DEVELOPED ASPIRATION PNEUMONIA ? TUBE FEED ASPIRATION RESULTING IN ARDS, SEVERE HYPOXIA, NEW INFILTRATES IN BOTH LOWER LOBES, SEPTIC SHOCK, MET ACIDOSIS, AKI, CYTOPENIAS, ?COAGULOPATHY     Plan: CNS-  Goal RASS -3 EEG continuously ongoing  Neuro managing  AED Rx appreciate NEUROLOGY F/U  D/w Dr Hulen Shouts, sedated Outlook for neuro recovery guarded   CVS  on   ANTIPLATELETS, STATIN, BETA BLOCKER, ASA IV amiodarone for VT/VF Further PCI  When AND IF  recovered neurologically and extubated 3/8 in shock due to sepsis with a background of ischemic cardiomyopathy Adding hi dose vit c, thiamine steroids for septic shock Pt does not qualify for angiotensin due risk of thrombosis and recent acute coronary syndrome- per Pharmacy May need rpt echo is shock does not improve  Resp PRVC VENT SUPPORT;  LIKELY NEEDS TRACH/PEG IF VENTILATOR DEPENDENT >10 DAYS 3/8 proned- change q8h fio2 can be weaned down as oxygn  improved. Keep Peep at 81;  vt 38m/kg On vanco+ meropenem  F/U CXR, ABG   GI,  Hold TUBE FEEDS On PUD PROPHYLAXIS F/U LFTS  Renal MONITOR LYTES, replete K 3/8 AKI sec to shock likely On bicarB drip for  Profound met acidosis D/w Renal- Dr DLorrene Reid Rec CRRT Place HD trialysis catheter    Endocrine MONITOR SUGARS  Heme MONITOR CBC On DVT  PROPHYLAXIS 3/8 check for DIC, monitor bld counts   LINES/TUBES: 3/5 ETT >> 3/5 LIJ CVL >> 3/5 OGT 3/5 Left Femoral  A line   FAMILY COUNSELING- updated father at bedside   D/w Cardiology, pharm   CRITICALLY ILL PROGNOSIS GUARDED    FAMILY COUNSELED- see family meeting note today; they are aware of critical condition/ guarded prognosis.    Thank you for letting me participate in the care of your patient. ^^^^^^^^^^ I  Have personally spent  75  Minutes  In the care of this Patient providing Critical care Services; Time includes review of chart, labs, imaging, coordinating care with other physicians and healthcare team members. Also includes time for frequent reevaluation and additional treatment implementation due to change in clinical condiiton of patient. Excludes time spent for Procedure and Teaching.  ^^^^^^^^^^ Note subject to typographical and grammatical errors;   Any formal questions or concerns about the content, text, or information contained within the body of this dictation should be directly addressed to the physician  for  clarification.  SEvans Lance MD Pulmonary and CGreenwoodPager: [phone]

## 2017-11-12 NOTE — Progress Notes (Signed)
Pt proned. Assisted RN with turning pt head without any complications

## 2017-11-12 NOTE — Discharge Summary (Signed)
PCCM discharge/death note:  DOA; November 06, 2017  DOD 09-Nov-2017     Patient Summary:  Name: Alexander Torres  MRN: [id number]  DOB: 07-Aug-1971  ADMISSION DATE: Nov 06, 2017  CONSULTATION DATE: 11/06/17  REFERRING MD: Dr. Angelena Form  CHIEF COMPLAINT: Cardiac Arrest   HISTORY OF PRESENT ILLNESS:  HPI obtained from medical chart review and from patient's wife in waiting room as patient is intubated and mechanically intubated.  47 year old male with past medical history significant for Diabetes and smoking presented from home 3/5 after cardiac arrest.  Wife reports patient has been complaining of intermittent left arm pain for the last 3 days. Denied any recent illness, complaints of chest pain or shortness of breath. Wife reports strong heart disease on patient's father side. The pain woke him up this morning. Wife talked with him then heard him collapse about 10 mins later and found him on couch unresponsive. She started CPR per 911 instructions. He was found in ventricular fibrilliation by EMS, defibrillated x 1 with 4 mins of CPR prior to ROSC. Unclear total downtime. He was given narcan without response by the [facility] department and again with EMS. He remained agonal and unresponsive requiring King airway. Inferior STEMI noted on EKG. On arrival to ER, [name] airway exchanged and then taken to the cath lab by Cardiology. Blood pressure stable and noted to be in Afib with RVR. In cath lab he was found to have severe diffuse coronary disease. LCx was revascularized. During reperfusion he suffered several runs of VT requiring 6 shocks, but no additional CPR. He remained unresponsive and therefore cooling protocol started.  PAST MEDICAL HISTORY :  He has a past medical history of Diabetes mellitus ([place]). Tobacco abuse.  PAST SURGICAL HISTORY:  He has a past surgical history that includes Coronary/Graft Acute MI Revascularization (N/A, 11/06/17) and LEFT HEART CATH AND CORONARY ANGIOGRAPHY (N/A, November 06, 2017).  New  Events  3/6  On hypothermia protocol>TTM - 35C  No szr on EEG (CONT)  No myoclonus  No secretions  Good urine outpt  No further arrythmias- on IV amiodarone  3/7  Reportedly had been unresponsive yesterday evening. He showed nonconvulsive status activity.  Neurology modifying antiepileptic therapy.  Minimal endotracheal secretions. No fever.  3/8  Earlier in the morning- [facility]/Night PCCP actively worked on decompensated pt from aspiration pneumonia, hypoxia, septic shock-> on Hi dose Epi, Levo, + Vaso, Peep 18, 100% fi02, PRONED;  Improved oxygenation, peripheral cyanosis; AKI developed. Oliguric/anuric  Family well aware of deterioration;  EEG showed burst suppression- medically induced per Neurology, seizures controlled with propofol versed, lincosamide, depk, keppra.  Bleeding from mouth noted.  RN HAD ADVANCED Feeding tube YESTERDAY BEFORE STARTING FEEDS.  RPT XRAY still showed tube in distal esophagus after which it was pushed down into stomach.  Scheduled Meds:  artificial tears  1 application  Both Eyes  Q8H  .  aspirin  81 mg  Oral  Daily  .  atorvastatin  80 mg  Oral  q1800  .  chlorhexidine gluconate (MEDLINE KIT)  15 mL  Mouth Rinse  BID  .  Chlorhexidine Gluconate Cloth  6 each  Topical  Daily  .  docusate  100 mg  Per Tube  BID  .  famotidine  20 mg  Per Tube  BID  .  feeding supplement (PRO-STAT SUGAR FREE 64)  60 mL  Per Tube  QID  .  feeding supplement (VITAL HIGH PROTEIN)  1,000 mL  Per Tube  Q24H  .  fentaNYL (SUBLIMAZE) injection  100 mcg  Intravenous  Once  .  heparin injection (subcutaneous)  5,000 Units  Subcutaneous  Q8H  .  hydrocortisone sod succinate (SOLU-CORTEF) inj  50 mg  Intravenous  Q6H  .  insulin aspart  1-3 Units  Subcutaneous  Q4H  .  mouth rinse  15 mL  Mouth Rinse  10 times per day  .  sennosides  5 mL  Per Tube  QHS  .  sodium chloride flush  10-40 mL  Intracatheter  Q12H  .    ticagrelor  90 mg  Oral  BID  Continuous Infusions:  .  sodium chloride  .  sodium chloride  .  sodium chloride  10 mL/hr at November 12, 2017 0900  .  albumin human  12.5 g (11-12-17 0821)  .  amiodarone  30 mg/hr (2017-11-12 0900)  .  cisatracurium (NIMBEX) infusion  3.007 mcg/kg/min (November 12, 2017 0900)  .  epinephrine  15.013 mcg/min (Nov 12, 2017 0900)  .  fentaNYL infusion INTRAVENOUS  300 mcg/hr (2017/11/12 0900)  .  lacosamide (VIMPAT) IV  Stopped (2017-11-12 0027)  .  levETIRAcetam  Stopped (2017-11-12 0855)  .  meropenem (MERREM) IV  Stopped (11/12/17 0530)  .  midazolam (VERSED) infusion  20 mg/hr (12-Nov-2017 0900)  .  norepinephrine (LEVOPHED) Adult infusion  80 mcg/min (November 12, 2017 0900)  .  phenylephrine (NEO-SYNEPHRINE) Adult infusion  320 mcg/min (November 12, 2017 0900)  .  propofol (DIPRIVAN) infusion  20 mcg/kg/min (11/12/2017 0900)  .  sodium bicarbonate infusion 1000 mL  150 mL/hr at 11-12-2017 0900  .  sodium chloride 0.9 % 100 mL with ascorbic acid 1,500 mg infusion  .  sodium chloride 0.9 % 50 mL with thiamine (B-1) 200 mg infusion  .  valproate sodium  750 mg (11/12/17 0646)  .  vancomycin  .  vasopressin (PITRESSIN) infusion - *FOR SHOCK*  0.03 Units/min (11/12/2017 0900)  PRN Meds:.sodium chloride, sodium chloride, acetaminophen **OR** acetaminophen (TYLENOL) oral liquid 160 mg/5 mL, albuterol, fentaNYL, ondansetron (ZOFRAN) IV, sodium chloride flush  Today's Vitals  11/12/17 0700  12-Nov-2017 0730  12-Nov-2017 0830  2017/11/12 0900  BP:  (!) 93/59  (!) 96/44  (!) 88/54  (!) 89/45  Pulse:  79  78  71  72  Resp:  (!) 34  (!) 34  (!) 30  (!) 34  Temp:  (!) 97.3 F (36.3 C)  TempSrc:  SpO2:  (!) 75%  (!) 75%  (!) 70%  (!) 89%  Weight:  Height:  Exam limited by PRONING  General: Middle aged male on vent  HEENT: Pupils Dilated, fixed, equal, fixed,  Cardiovascular: Tachy, regular, n s1 s2 - no murmur  Lungs: Coarse bilateral breath sounds. Vent  supported breaths.  Abdomen: Soft, non-distended, BS+  Musculoskeletal: No acute deformity  No edema  Tone- paralyzed  Skin: peripheral cyanosis  Vent Mode: PRVC  FiO2 (%): [40 %-100 %] 100 %  Set Rate: [16 bmp-34 bmp] 34 bmp  Vt Set: [550 mL] 550 mL  PEEP: [5 cmH20-18 cmH20] 18 cmH20  Plateau Pressure: [13 cmH20-35 cmH20] 29 cmH20  CT brain 3/6  1. No acute intracranial abnormalities. Normal brain.  11/03/2017 ekg  Atrial fibrillation  Inferoposterior infarct, acute (RCA)  Probable RV involvement, suggest recording right precordial leads  3/6 TTE  - Left ventricle: The cavity size was normal. Wall thickness was  normal. Systolic function was moderately reduced. The estimated  ejection fraction was in the range of 35% to 40%. Severe  hypokinesis of  the entireinferolateral myocardium; consistent  with ischemia or infarction in the distribution of the left  circumflex coronary artery. The study is not technically  sufficient to allow evaluation of LV diastolic function. Acoustic  contrast opacification revealed no evidence ofthrombus.  - Mitral valve: Calcified annulus. There was mild regurgitation.  - Left atrium: The atrium was mildly dilated.  - Right ventricle: Systolic function was mildly reduced.  18-Oct-2017 06:33:09 EKG  Normal sinus rhythm  Nonspecific ST and T wave abnormality  Prolonged QT  Abnormal ECG  3/8/AXR  Airspace disease at the left greater than right bases. Esophageal  tube is looped upon itself in the left hemiabdomen with the tip  directed cephalad at overlying the left diaphragmatic region.  Gasless abdomen  chest X-ray 3/8  Endotracheal tube tip is about 4.4 cm superior to the carina.  Left-sided central venous catheter tip overlies the SVC. Esophageal  tube tip is below the diaphragm.  No change in confluent airspace disease within the left mid to lower  lung with patchy airspace disease in the right lung base. Stable  cardiomediastinal silhouette.    Results for TAYSOM, GLYMPH (MRN [id number]) as of 10/28/2017 09:44  Ref. Range  10/28/2017 04:00  2017/10/28 04:35  10/28/17 04:45  pH, Arterial  Latest Ref Range: 7.350 - 7.450  7.079 (LL)  7.029 (LL)  pCO2 arterial  Latest Ref Range: 32.0 - 48.0 mmHg  62.7 (H)  57.5 (H)  pO2, Arterial  Latest Ref Range: 83.0 - 108.0 mmHg  415 (H)  355 (H)  Acid-base deficit  Latest Ref Range: 0.0 - 2.0 mmol/L  10.7 (H)  14.5 (H)  Bicarbonate  Latest Ref Range: 20.0 - 28.0 mmol/L  17.7 (L)  14.5 (L)  Results for MONTANA, FASSNACHT (MRN [id number]) as of 2017/10/28 09:44  Ref. Range  10/28/2017 04:35  2017-10-28 04:45  10-28-17 05:27  Sodium  Latest Ref Range: 135 - 145 mmol/L  138  Potassium  Latest Ref Range: 3.5 - 5.1 mmol/L  3.1 (L)  Chloride  Latest Ref Range: 101 - 111 mmol/L  98 (L)  CO2  Latest Ref Range: 22 - 32 mmol/L  14 (L)  Glucose  Latest Ref Range: 65 - 99 mg/dL  84  BUN  Latest Ref Range: 6 - 20 mg/dL  15  Creatinine  Latest Ref Range: 0.61 - 1.24 mg/dL  2.07 (H)  Calcium  Latest Ref Range: 8.9 - 10.3 mg/dL  7.6 (L)  Anion gap  Latest Ref Range: 5 - 15  26 (H)  Phosphorus  Latest Ref Range: 2.5 - 4.6 mg/dL  7.8 (H)  Magnesium  Latest Ref Range: 1.7 - 2.4 mg/dL  2.0  GFR, Est Non African American  Latest Ref Range: >60 mL/min  36 (L)  GFR, Est African American  Latest Ref Range: >60 mL/min  42 (L)  Lactic Acid, Venous  Latest Ref Range: 0.5 - 1.9 mmol/L  12.9 (HH)  CBC  Latest Ref Rng & Units  28-Oct-2017  10/18/2017  10/18/2017  WBC  4.0 - 10.5 K/uL  2.4(L)  -  9.7  Hemoglobin  13.0 - 17.0 g/dL  9.7(L)  10.5(L)  10.7(L)  Hematocrit  39.0 - 52.0 %  30.4(L)  31.0(L)  31.8(L)  Platelets  150 - 400 K/uL  207  -  216  BMP  Latest Ref Rng & Units  10/28/17  10/18/2017  10/18/2017  Glucose  65 - 99 mg/dL  84  132(H)  126(H)  BUN  6 - 20 mg/dL  15  11  <5(L)  Creatinine  0.61 - 1.24 mg/dL  2.07(H)  1.00  0.56(L)  Sodium  135 - 145  mmol/L  138  133(L)  134(L)  Potassium  3.5 - 5.1 mmol/L  3.1(L)  3.4(L)  4.0  Chloride  101 - 111 mmol/L  98(L)  98(L)  102  CO2  22 - 32 mmol/L  14(L)  -  22  Calcium  8.9 - 10.3 mg/dL  7.6(L)  -  8.7(L)  Results for MANOLO, BOSKET (MRN [id number]) as of Oct 29, 2017 09:44  Ref. Range  October 29, 2017 04:35  WBC  Latest Ref Range: 4.0 - 10.5 K/uL  2.4 (L)  RBC  Latest Ref Range: 4.22 - 5.81 MIL/uL  3.24 (L)  Hemoglobin  Latest Ref Range: 13.0 - 17.0 g/dL  9.7 (L)  HCT  Latest Ref Range: 39.0 - 52.0 %  30.4 (L)  MCV  Latest Ref Range: 78.0 - 100.0 fL  93.8  MCH  Latest Ref Range: 26.0 - 34.0 pg  29.9  MCHC  Latest Ref Range: 30.0 - 36.0 g/dL  31.9  RDW  Latest Ref Range: 11.5 - 15.5 %  14.4  Platelets  Latest Ref Range: 150 - 400 K/uL  207  Valproic Acid,S  Latest Ref Range: 50.0 - 100.0 ug/mL  39 (L)  Glucose  Latest Ref Range: 65 - 99 mg/dL  84  Coronary/Graft Acute MI Revascularization  LEFT HEART CATH AND CORONARY ANGIOGRAPHY  11/04/2017  .Ost Cx lesion is 100% stenosed.  Colon Flattery LAD to Prox LAD lesion is 65% stenosed.  Colon Flattery 1st Diag lesion is 90% stenosed.  .Prox LAD lesion is 65% stenosed.  Colon Flattery RCA to Prox RCA lesion is 80% stenosed.  .A drug-eluting stent was successfully placed using a STENT SYNERGY DES 3X24.  Marland KitchenPost intervention, there is a 0% residual stenosis.  .Mid Cx lesion is 90% stenosed.  Colon Flattery 2nd Mrg lesion is 70% stenosed.  1. Acute inferolateral STEMI secondary to proximal occlusion of the large, dominant Circumflex artery.  2. Successful PTCA/DES x 1 proximal Circumflex artery  3. Moderately severe stenosis in the proximal LAD and mid LAD  4. Residual stenosis in the mid AV groove Circumflex artery and ostium of the first OM branch.  5. Severe stenosis in the small, non-dominant RCA  Recommendations: He will be admitted to the ICU. PCCM to admit and begin cooling protocol. I will continue IV amiodarone. The IV Cangrelor will be continued  for now and will be stopped once his Brilinta loading dose has been given. Rectal ASA will be given now. High dose statin will be started. Echo today to assess LVEF. I will ask the CHF/Shock team to follow.  If he makes a full neurological recovery, will need repeat cardiac to consider PCI of the Circumflex and LAD.  ASSESSMENT / PLAN:  .47 Y/O DIABETIC MALE, SMOKER, S/P V FIB ARREST- ROSC < 5 MIN, POST LCX PCI /DES  .MULTIVESSEL CAD  .ACUTE RESPIRATORY FAILURE WITH MET/RESP ACIDOSIS  .R 3,4 LATERAL RIB FXR SEC TO CPR- NO EVIDENCE OF BAROTRAUMA  .AT RISK FOR ANOXIC ENCEPHALOPATHY  .STRESS>>LEUKOCYTOSIS  .HIGH LDL  .AT RISK FOR SHOCK LIVER AND ATN/AKI  .Hx Chronic neck pain  .H/o DM, hyperlipidemia, tobacco use  3/7 OVERNIGHT DEVELOPED ASPIRATION PNEUMONIA ? TUBE FEED ASPIRATION RESULTING IN ARDS, SEVERE HYPOXIA, NEW INFILTRATES IN BOTH LOWER LOBES, SEPTIC SHOCK, MET ACIDOSIS, AKI, CYTOPENIAS, ?COAGULOPATHY  Pt was proned for ventilation and oxygenation improved;  AKI with metabolic acidosis - planned for HD-CRRT to be started.  Pt sustained CP arrest. ACLS protocol perfrmed. Pt did not respond. Family asked to stop further resuscitation. He expired peacefully.  LINES/TUBES:  3/5 ETT >>  3/5 LIJ CVL >>  3/5 OGT  3/5 Left Femoral A line  Time spent for d/c summary process-25 minutes  Onelia Cadmus Anders Simmonds, MD  [facility] [place]  Pager: [phone]   Revision History                      Routing History

## 2017-11-12 NOTE — Progress Notes (Signed)
Subjective: Severe worsening overnight, now paralyzed and prone.   Exam: Vitals:   04-27-18 0830 04-27-18 0900  BP: (!) 88/54 (!) 89/45  Pulse: 71 72  Resp: (!) 30 (!) 34  Temp:  (!) 97.3 F (36.3 C)  SpO2: (!) 70% (!) 89%   Gen: In bed, NAD Resp: non-labored breathing, no acute distress Abd: soft, nt  Neuro: MS: does not open eyes or follow commands.  CN: pupils reactive bilaterally, corneals intact Motor: flexion x 4. He has increased tone in all 4 ext Sensory:as above.   Pertinent Labs: VPA 32 Mg 1.6  I have spot-reviewed the EEG- Frequent right discharges, maximal at C4, P4, F4.   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 He appears to be having seizures.   At this time, I do not have any clear signs to state a clear prognosis, would continue aggresive care until a more clear recommendation could be made.  He has had a medical worsening, can decrease sedative even further if need be.   Recommendations: 1) Continue keppra 1.5g BID 2) Additional 1g depakote this am, increase to 750mg  TID, level again tomorrow.  3) vimpat 200mg  BID.  4) versed drip, wean today  This patient is critically ill and at significant risk of neurological worsening, death and care requires constant monitoring of vital signs, hemodynamics,respiratory and cardiac monitoring, neurological assessment, discussion with family, other specialists and medical decision making of high complexity. I spent 45 minutes of neurocritical care time  in the care of  this patient.  Ritta SlotMcNeill Adalie Mand, MD [facility] [phone]  If 7pm- 7am, please page neurology on call as listed in AMION. 10/20/2017  9:43 AM

## 2017-11-12 NOTE — Progress Notes (Signed)
   11-04-2017 1200  Clinical Encounter Type  Visited With Patient and family together  Visit Type Spiritual support  Referral From Chaplain  Spiritual Encounters  Spiritual Needs Prayer  Respond to code blue on 2 heart. Met with family and provided prayer and emotional support. Pt. Died provided comfort and escorted family to last visit with departed loved one. Father stated that family decided on [facility] in [place]. The nurse said she will contact funeral home. Matthew Folks

## 2017-11-12 NOTE — Progress Notes (Signed)
Code blue called @ 11.00am

## 2017-11-12 NOTE — Progress Notes (Signed)
Progress Note  Patient Name: Alexander Torres Date of Encounter: 2017/11/02  Primary Cardiologist: new  Dr. Julianne Handler  Subjective   Intubated sedated on hypothermia; Day 3 s/p out of hospital cardiac arrest; now on pressor support  Inpatient Medications    Scheduled Meds: . artificial tears  1 application Both Eyes [id number]  . aspirin  81 mg Oral Daily  . atorvastatin  80 mg Oral q1800  . chlorhexidine gluconate (MEDLINE KIT)  15 mL Mouth Rinse BID  . Chlorhexidine Gluconate Cloth  6 each Topical Daily  . docusate  100 mg Per Tube BID  . famotidine  20 mg Per Tube BID  . feeding supplement (PRO-STAT SUGAR FREE 64)  60 mL Per Tube QID  . feeding supplement (VITAL HIGH PROTEIN)  1,000 mL Per Tube Q24H  . fentaNYL (SUBLIMAZE) injection  100 mcg Intravenous Once  . heparin injection (subcutaneous)  5,000 Units Subcutaneous Q8H  . hydrocortisone sod succinate (SOLU-CORTEF) inj  50 mg Intravenous Q6H  . insulin aspart  1-3 Units Subcutaneous Q4H  . mouth rinse  15 mL Mouth Rinse 10 times per day  . sennosides  5 mL Per Tube QHS  . sodium chloride flush  10-40 mL Intracatheter Q12H  . ticagrelor  90 mg Oral BID   Continuous Infusions: . sodium chloride    . sodium chloride    . sodium chloride 10 mL/hr at 2017-11-02 0900  . albumin human 12.5 g (11-02-17 0821)  . amiodarone 30 mg/hr (2017/11/02 0900)  . cisatracurium (NIMBEX) infusion 3.007 mcg/kg/min (02-Nov-2017 0900)  . epinephrine 15.013 mcg/min (11-02-17 0900)  . fentaNYL infusion INTRAVENOUS 300 mcg/hr (2017/11/02 0900)  . lacosamide (VIMPAT) IV Stopped (11/02/2017 0027)  . levETIRAcetam Stopped (2017/11/02 0855)  . meropenem (MERREM) IV Stopped (11-02-2017 0530)  . midazolam (VERSED) infusion 20 mg/hr (02-Nov-2017 0900)  . norepinephrine (LEVOPHED) Adult infusion 80 mcg/min (11-02-2017 0900)  . phenylephrine (NEO-SYNEPHRINE) Adult infusion 320 mcg/min (November 02, 2017 0900)  . propofol (DIPRIVAN) infusion 20 mcg/kg/min (2017/11/02 0900)  .  sodium  bicarbonate  infusion 1000 mL 150 mL/hr at November 02, 2017 0900  . sodium chloride 0.9 % 100 mL with ascorbic acid 1,500 mg infusion    . sodium chloride 0.9 % 50 mL with thiamine (B-1) 200 mg infusion    . valproate sodium 750 mg (November 02, 2017 0646)  . vancomycin    . vasopressin (PITRESSIN) infusion - *FOR SHOCK* 0.03 Units/min (02-Nov-2017 0900)   PRN Meds: sodium chloride, sodium chloride, acetaminophen **OR** acetaminophen (TYLENOL) oral liquid 160 mg/5 mL, albuterol, fentaNYL, ondansetron (ZOFRAN) IV, sodium chloride flush   Vital Signs    Vitals:   Nov 02, 2017 0700 02-Nov-2017 0730 Nov 02, 2017 0830 11/02/2017 0900  BP: (!) 93/59 (!) 96/44 (!) 88/54 (!) 89/45  Pulse: 79 78 71 72  Resp: (!) 34 (!) 34 (!) 30 (!) 34  Temp:    (!) 97.3 F (36.3 C)  TempSrc:      SpO2: (!) 75% (!) 75% (!) 70% (!) 89%  Weight:      Height:        Intake/Output Summary (Last 24 hours) at 11/02/17 0934 Last data filed at Nov 02, 2017 0900 Gross per 24 hour  Intake 4845.1 ml  Output 2125 ml  Net 2720.1 ml    I/O since admission: - 2221  Filed Weights   11/07/2017 0602 10/17/17 0421 10/18/17 0500  Weight: 242 lb 8.1 oz (110 kg) 205 lb 14.6 oz (93.4 kg) 218 lb 11.1 oz (99.2 kg)    Telemetry  Sinus ~90 - 100 - Personally Reviewed  ECG    10/18/2017 ECG (independently read by me): NSR at 81; early transition V2 c/w posterior wall involvement; QTc 490   10/17/2017 ECG (independently read by me): NSR at 71; resolution of STT changes; QTc 519  10/31/2017 ECG (independently read by me): AF at 133, marked ST elevation 2,3,aVF, V5-6 with significant ST depression I,aVL, V1-4  Physical Exam    BP (!) 89/45   Pulse 72   Temp (!) 97.3 F (36.3 C)   Resp (!) 34   Ht 5' 11.5" (1.816 m)   Wt 218 lb 11.1 oz (99.2 kg)   SpO2 (!) 89%   BMI 30.08 kg/m  General: Intubated and sedated, prone position Skin: normal turgor, no rashes, warm and dry HEENT: Normocephalic, atraumatic.  Nose without nasal septal  hypertrophy Mouth/Parynx: bleeding from mouth Neck: No JVD, no carotid bruits; normal carotid upstroke Lungs: clear to ausculatation and percussion; no wheezing or rales Chest wall: without tenderness to palpitation Heart: PMI not displaced, RRR, s1 s2 normal, 1/6 systolic murmur, no diastolic murmur, no rubs, gallops, thrills, or heaves Abdomen: currently in prone position Back: no CVA tenderness Pulses 2+ Musculoskeletal: full range of motion, normal strength, no joint deformities Extremities: no significant edema Neurologic: grossly nonfocal; Cranial nerves grossly wnl Psychologic: Normal mood and affect   Labs    Chemistry Recent Labs  Lab 10/17/2017 0650  10/17/17 0412 10/17/17 1013 10/18/17 0354 10/18/17 2249 Nov 15, 2017 0435  NA  --    < >  --  135 134* 133* 138  K  --    < >  --  3.8 4.0 3.4* 3.1*  CL  --    < >  --  106 102 98* 98*  CO2  --    < >  --  20* 22  --  14*  GLUCOSE  --    < >  --  122* 126* 132* 84  BUN  --    < >  --  5* <5* 11 15  CREATININE  --    < >  --  0.49* 0.56* 1.00 2.07*  CALCIUM  --    < >  --  8.7* 8.7*  --  7.6*  PROT 6.3*  --  5.6*  --  5.5*  --   --   ALBUMIN 3.6  --  3.1*  --  2.9*  --   --   AST 129*  --  154*  --  77*  --   --   ALT 61  --  69*  --  48  --   --   ALKPHOS 68  --  48  --  50  --   --   BILITOT 0.7  --  0.7  --  0.6  --   --   GFRNONAA  --    < >  --  >60 >60  --  36*  GFRAA  --    < >  --  >60 >60  --  42*  ANIONGAP  --    < >  --  9 10  --  26*   < > = values in this interval not displayed.     Hematology Recent Labs  Lab 10/17/17 0412 10/18/17 0354 10/18/17 2249 Nov 15, 2017 0435  WBC 9.7 9.7  --  2.4*  RBC 3.99* 3.54*  --  3.24*  HGB 12.0* 10.7* 10.5* 9.7*  HCT 35.1* 31.8* 31.0* 30.4*  MCV 88.0 89.8  --  93.8  MCH 30.1 30.2  --  29.9  MCHC 34.2 33.6  --  31.9  RDW 13.6 13.7  --  14.4  PLT 245 216  --  207    Cardiac Enzymes Recent Labs  Lab 11/04/2017 0947 11/05/2017 1645 10/12/2017 2112 10/17/17 0412   TROPONINI 22.17* 47.59* 39.10* 29.05*   No results for input(s): TROPIPOC in the last 168 hours.   BNPNo results for input(s): BNP, PROBNP in the last 168 hours.   DDimer No results for input(s): DDIMER in the last 168 hours.   Lipid Panel     Component Value Date/Time   CHOL 226 (H) 11/08/2017 0650   TRIG 148 11/02/2017 0650   HDL 29 (L) 10/22/2017 0650   CHOLHDL 7.8 10/12/2017 0650   VLDL 30 10/18/2017 0650   LDLCALC 167 (H) 10/17/2017 0650    Radiology    Dg Abd 1 View  Result Date: 2017/11/11 CLINICAL DATA:  OG tube placement EXAM: ABDOMEN - 1 VIEW COMPARISON:  Chest x-ray 11-11-2017 FINDINGS: Airspace disease at the left greater than right bases. Esophageal tube is looped upon itself in the left hemiabdomen with the tip directed cephalad at overlying the left diaphragmatic region. Gasless abdomen IMPRESSION: Esophageal tube tip looped upon itself in the left abdomen with tip directed cephalad in the region of left diaphragm. Electronically Signed   By: Donavan Foil M.D.   On: 11-11-2017 03:19   Dg Chest Port 1 View  Result Date: 11-11-2017 CLINICAL DATA:  Adjustment of endotracheal tube EXAM: PORTABLE CHEST 1 VIEW COMPARISON:  2017/11/11, 10/18/2017, 10/17/2017, 10/25/2017 FINDINGS: Endotracheal tube tip is about 4.4 cm superior to the carina. Left-sided central venous catheter tip overlies the SVC. Esophageal tube tip is below the diaphragm. No change in confluent airspace disease within the left mid to lower lung with patchy airspace disease in the right lung base. Stable cardiomediastinal silhouette. IMPRESSION: 1. Endotracheal tube tip about 4.4 cm superior to carina 2. No significant change in the left greater than right bilateral airspace disease. Electronically Signed   By: Donavan Foil M.D.   On: 2017-11-11 03:18   Dg Chest Port 1 View  Result Date: 11-Nov-2017 CLINICAL DATA:  ETT placement EXAM: PORTABLE CHEST 1 VIEW COMPARISON:  10/18/2017, 10/17/2017, 10/22/2017  FINDINGS: Endotracheal tube tip is about 7.5 cm superior to carina. Left-sided central venous catheter tip overlies the SVC. Esophageal tube has been advanced, but the tip is not clearly visible. Slightly improved aeration of the right lung base. Persistent confluent airspace disease in the left mid and lower lung. Possible nodular opacities in the peripheral right lung. Stable cardiomediastinal silhouette. Multiple right rib fractures. IMPRESSION: 1. Endotracheal tube tip about 7.5 cm superior to carina 2. Esophageal tube has been advanced, tip probably below the diaphragm but not identified with certainty 3. Slightly improved aeration of the right base with residual streaky infiltrate noted. Persistent confluent airspace disease in the left mid and lower lung 4. Possible nodules in the right mid and lower lung; could further evaluate with chest CT. Electronically Signed   By: Donavan Foil M.D.   On: 11-Nov-2017 03:02   Dg Chest Port 1 View  Result Date: 10/18/2017 CLINICAL DATA:  Hypoxia EXAM: PORTABLE CHEST 1 VIEW COMPARISON:  10/18/2017, 10/17/2017, 11/06/2017 FINDINGS: Endotracheal tube tip is about 6.3 cm superior to the carina. Tip of the esophageal tube possibly projects over the distal esophagus. Left-sided central venous catheter tip overlies the SVC origin. Worsening  of airspace disease within the left greater than right lower lungs. Probable small effusions. Stable cardiomediastinal silhouette. IMPRESSION: 1. Endotracheal tube tip about 6.3 cm above carina. 2. Esophageal tube tip projects over the distal mediastinum/esophagus, side-port at the level of mid esophagus. Further advancement recommended for more optimal positioning 3. Significant worsening of bibasilar airspace disease. Probable small effusions. These results will be called to the ordering clinician or representative by the Radiologist Assistant, and communication documented in the PACS or zVision Dashboard. Electronically Signed   By: Donavan Foil M.D.   On: 10/18/2017 23:31   Dg Chest Port 1 View  Result Date: 10/18/2017 CLINICAL DATA:  Acute respiratory failure, hypoxia, history of diabetes EXAM: PORTABLE CHEST 1 VIEW COMPARISON:  Portable chest x-ray of 10/17/2016 FINDINGS: The tip of the endotracheal tube is approximately 6.1 cm above the carina. Bibasilar atelectasis has increased right much greater than left. Developing pneumonia cannot be excluded. Left IJ central venous line tip overlies the mid SVC. Mild cardiomegaly is stable. IMPRESSION: 1. Tip of endotracheal tube 6.1 cm above the carina. 2. Increasing bibasilar atelectasis, right greater than left. Electronically Signed   By: Ivar Drape M.D.   On: 10/18/2017 09:33    Cardiac Studies   10/15/2017 Cath/PCI Conclusion     Ost Cx lesion is 100% stenosed.  Ost LAD to Prox LAD lesion is 65% stenosed.  Ost 1st Diag lesion is 90% stenosed.  Prox LAD lesion is 65% stenosed.  Ost RCA to Prox RCA lesion is 80% stenosed.  A drug-eluting stent was successfully placed using a STENT SYNERGY DES 3X24.  Post intervention, there is a 0% residual stenosis.  Mid Cx lesion is 90% stenosed.  Ost 2nd Mrg lesion is 70% stenosed.   1. Acute inferolateral STEMI secondary to proximal occlusion of the large, dominant Circumflex artery.  2. Successful PTCA/DES x 1 proximal Circumflex artery 3. Moderately severe stenosis in the proximal LAD and mid LAD 4. Residual stenosis in the mid AV groove Circumflex artery and ostium of the first OM branch.  5. Severe stenosis in the small, non-dominant RCA  Recommendations: He will be admitted to the ICU. PCCM to admit and begin cooling protocol. I will continue IV amiodarone. The IV Cangrelor will be continued for now and will be stopped once his Brilinta loading dose has been given. Rectal ASA will be given now. High dose statin will be started. Echo today to assess LVEF. I will ask the CHF/Shock team to follow.  If he makes a full  neurological recovery, will need repeat cardiac to consider PCI of the Circumflex and LAD.           ------------------------------------------------------------------- 10/29/2017 ECHO Study Conclusions  - Left ventricle: The cavity size was normal. Wall thickness was   normal. Systolic function was moderately reduced. The estimated   ejection fraction was in the range of 35% to 40%. Severe   hypokinesis of the entireinferolateral myocardium; consistent   with ischemia or infarction in the distribution of the left   circumflex coronary artery. The study is not technically   sufficient to allow evaluation of LV diastolic function. Acoustic   contrast opacification revealed no evidence ofthrombus. - Mitral valve: Calcified annulus. There was mild regurgitation. - Left atrium: The atrium was mildly dilated. - Right ventricle: Systolic function was mildly reduced.  Patient Profile     47 y.o. male who presented after an out of hospital cardiac arrest/post CPR and underwent emergent cath/PCI to totally occluded LCx.  Assessment & Plan    1. Day 3 s/p out of hospital VF cardiac arrest; ? Down time prior to CPR > 10 minutes by report; s/p hypothermia; Now requiring neosynephrine, levophed, epinephrine and vasopressin.  MAP >65.  Still on amiodarone drip; no recurrent arrythmia.  2. LCX STEMI; s/p DES stent to LCX; resolution of ECG changes; initial EF 35 - 40%.Peak troponin 47.59.    3. Concomitant CAD of distal LCX; RCA and LAD; depending of neuro recovery, will need PCI  4. ARDS: paralyzed on nimbex; Now on  Vit C protocol (thiamine, Vit C, hydrocortisone) Now prone to improve aeration.  Septic shock on vancomycin and meropenem  4. Respiratory Failure: on vent per CCM; on versed drip,propofol and fetanyl.  Will order triglyceride level with duration of propofol.  5. Anoxic encephalopy;  Neuro following with EEG; Recurrent seizures yesterday.  Now on keppra, depakote and vimpat/  Midazolin drip added yesterday.  6. Hyperlipidemia: LDL 67; now on atorvastatin 80 mg.  7. Prolonged QTc; 519 on amiodarone; Mg 1.9; Ca 8.7' K 4.7; QTc better 490 msec this am  8. LFT elevation: prob from MI, hypotension, rather than atorvastatin; monitor;  Improved today with AST 77, ALT 48.  9. DM: on sliding scale insulin  10: H/O tobacco  Critical care; 40 minutes  Signed, Troy Sine, MD, [facility] 10-28-17, 9:34 AM

## 2017-11-12 NOTE — Progress Notes (Signed)
Hypoxia MARKEDLY improved since paralyzing and proning patient. PCO2 is coming down somewhat. However his metabolic acidosis continues to worsen, with the end result of pH steadily dropping. Already received 2 amps Bicarb earlier overnight. Since he is now showing us that he can oxygenate, I am not as worried about IVF volume causing pulmonary edema. Therefore will give Albumin IV (25% 50g IV q3hrs x 3) and start Bicarb gtt (150MEQ NaHCO2 in D5W @ 150cc/hr). Patient currently on 4 vasopressors (Vasopression @ 0.03, Neo @ 350mcg, Levoe @ 80mcg, and Epi @ 15). Repeat Lactate now and continue to trend q3hrs.

## 2017-11-12 NOTE — Progress Notes (Addendum)
CODE BLUE note  Start 11.15 am; End 11.32 am.   Pt became bradycardic>>asystole; ACLS protocol started. CPR,  calcium, bicarb, epi administered' meds given per protocol. Bleeding from mouth and ETT noted.  15 minutes into code-> POCUS->>>Cardiac standtill noted;     No pericardial effusion.   K 4,   Likely severe acidosis, from AKI was  Precipitant to the terminal event.  Family were watching ongoing CPR.  They asked us to terminate further resuscitation.  Time of death- 11.32 am.  Emotional support and condolences offered to family. They appreciated the ICU team's   efforts for caring for Alexander Torres.    Caryl ComesSiva P Sivakumar, MD Pulmonary and Critical Care Medicine [facility] Pager: [phone]

## 2017-11-12 NOTE — Progress Notes (Signed)
Nimbex intiated at 0205  BIS 35 with propofol at 40mcg versed at 30mg  and fentanyl at 300mcg TOF=4 at 40 mA.

## 2017-11-12 NOTE — Discharge Summary (Addendum)
PCCM discharge/death note:  DOA; November 06, 2017 DOD November 09, 2017    ICU day #  4  Hospital day  4   Mechanical Ventilation day # 4     Patient Summary:      Name: Alexander Torres  MRN:   [id number]  DOB:   [birth year]            ADMISSION DATE:  2017-11-06  CONSULTATION DATE:  11/06/2017   REFERRING MD:  Dr. Angelena Form   CHIEF COMPLAINT:  Cardiac Arrest   HISTORY OF PRESENT ILLNESS:    HPI obtained from medical chart review and from patient's wife in waiting room as patient is intubated and mechanically intubated.     47 year old male with past medical history significant for Diabetes and smoking presented from home 3/5 after cardiac arrest.   Wife reports patient has been complaining of intermittent left arm pain for the last 3 days.  Denied any recent illness, complaints of chest pain or shortness of breath.   Wife reports strong heart disease on patient's father side. The pain woke him up this morning.  Wife talked with him then heard him collapse about 10 mins later and found him on couch unresponsive.  She started CPR per 911 instructions.  He was found in ventricular fibrilliation by EMS, defibrillated x 1 with 4 mins of CPR prior to ROSC.  Unclear total downtime.  He was given narcan without response by the [facility] department and again with EMS.  He remained agonal and unresponsive requiring King airway.  Inferior STEMI noted on EKG.  On arrival to ER, [name] airway exchanged and then taken to the cath lab by Cardiology.  Blood pressure stable and noted to be in Afib with RVR. In cath lab he was found to have severe diffuse coronary disease. LCx was revascularized. During reperfusion he suffered several runs of VT requiring 6 shocks, but no additional CPR.  He remained unresponsive and therefore cooling protocol started.        PAST MEDICAL HISTORY :   He  has a past medical history of Diabetes mellitus ([place]). Tobacco abuse.   PAST SURGICAL HISTORY:  He  has a past surgical history that  includes Coronary/Graft Acute MI Revascularization (N/A, 11-06-2017) and LEFT HEART CATH AND CORONARY ANGIOGRAPHY (N/A, 11/06/17).      New Events   3/6  On hypothermia protocol>TTM - 35C  No szr on EEG (CONT)  No myoclonus  No secretions  Good urine outpt  No further arrythmias- on IV amiodarone      3/7  Reportedly had been unresponsive yesterday evening.  He showed nonconvulsive status activity.  Neurology modifying antiepileptic therapy.  Minimal endotracheal secretions.  No fever.        3/8  Earlier in the morning- [facility]/Night [facility] actively worked on decompensated pt from aspiration pneumonia, hypoxia, septic shock-> on Hi dose Epi, Levo, + Vaso, Peep 18, 100% fi02, PRONED;  Improved oxygenation, peripheral cyanosis; AKI developed. Oliguric/anuric     Family well aware of deterioration;  EEG showed burst suppression- medically induced per Neurology, seizures controlled with propofol versed, lincosamide, depk, keppra.  Bleeding from mouth noted.     RN HAD ADVANCED  Feeding tube YESTERDAY BEFORE STARTING FEEDS.  RPT XRAY still showed tube in distal esophagus after which it was pushed down into stomach.     Scheduled Meds:  artificial tears    1 application   Both Eyes   Q8H    .  aspirin    81 mg   Oral   Daily    .   atorvastatin    80 mg   Oral   q1800    .   chlorhexidine gluconate (MEDLINE KIT)    15 mL   Mouth Rinse   BID    .   Chlorhexidine Gluconate Cloth    6 each   Topical   Daily    .   docusate    100 mg   Per Tube   BID    .   famotidine    20 mg   Per Tube   BID    .   feeding supplement (PRO-STAT SUGAR FREE 64)    60 mL   Per Tube   QID    .   feeding supplement (VITAL HIGH PROTEIN)    1,000 mL   Per Tube   Q24H    .   fentaNYL (SUBLIMAZE) injection    100 mcg   Intravenous   Once    .   heparin injection (subcutaneous)    5,000  Units   Subcutaneous   Q8H    .   hydrocortisone sod succinate (SOLU-CORTEF) inj    50 mg   Intravenous   Q6H    .   insulin aspart    1-3 Units   Subcutaneous   Q4H    .   mouth rinse    15 mL   Mouth Rinse   10 times per day    .   sennosides    5 mL   Per Tube   QHS    .   sodium chloride flush    10-40 mL   Intracatheter   Q12H    .   ticagrelor    90 mg   Oral   BID       Continuous Infusions:    .   sodium chloride        .   sodium chloride        .   sodium chloride   10 mL/hr at 11-13-17 0900    .   albumin human   12.5 g (2017-11-13 0821)    .   amiodarone   30 mg/hr (11-13-2017 0900)    .   cisatracurium (NIMBEX) infusion   3.007 mcg/kg/min (13-Nov-2017 0900)    .   epinephrine   15.013 mcg/min (November 13, 2017 0900)    .   fentaNYL infusion INTRAVENOUS   300 mcg/hr (2017/11/13 0900)    .   lacosamide (VIMPAT) IV   Stopped (11/13/17 0027)    .   levETIRAcetam   Stopped (Nov 13, 2017 0855)    .   meropenem (MERREM) IV   Stopped (13-Nov-2017 0530)    .   midazolam (VERSED) infusion   20 mg/hr (11/13/2017 0900)    .   norepinephrine (LEVOPHED) Adult infusion   80 mcg/min (2017-11-13 0900)    .   phenylephrine (NEO-SYNEPHRINE) Adult infusion   320 mcg/min (13-Nov-2017 0900)    .   propofol (DIPRIVAN) infusion   20 mcg/kg/min (11-13-2017 0900)    .    sodium bicarbonate  infusion 1000 mL   150 mL/hr at 11/13/2017 0900    .   sodium chloride 0.9 % 100 mL with ascorbic acid 1,500 mg infusion        .   sodium chloride 0.9 % 50 mL with thiamine (B-1) 200 mg infusion        .  valproate sodium   750 mg (11-07-17 0646)    .   vancomycin        .   vasopressin (PITRESSIN) infusion - *FOR SHOCK*   0.03 Units/min (07-Nov-2017 0900)       PRN Meds:.sodium chloride, sodium chloride, acetaminophen **OR** acetaminophen (TYLENOL) oral  liquid 160 mg/5 mL, albuterol, fentaNYL, ondansetron (ZOFRAN) IV, sodium chloride flush             Today's Vitals        2017/11/07 0700   11/07/17 0730   11-07-17 0830   2017/11/07 0900    BP:   (!) 93/59   (!) 96/44   (!) 88/54   (!) 89/45    Pulse:   79   78   71   72    Resp:   (!) 34   (!) 34   (!) 30   (!) 34    Temp:               (!) 97.3 F (36.3 C)    TempSrc:                    SpO2:   (!) 75%   (!) 75%   (!) 70%   (!) 89%    Weight:                    Height:                          Exam limited by PRONING   General:  Middle aged male on vent  HEENT:   Pupils  Dilated, fixed, equal, fixed,  Cardiovascular:  Tachy, regular, n s1 s2 - no murmur  Lungs:  Coarse bilateral breath sounds. Vent supported breaths.   Abdomen:  Soft, non-distended, BS+  Musculoskeletal:  No acute deformity  No edema  Tone- paralyzed  Skin:  peripheral cyanosis     Vent Mode: PRVC  FiO2 (%):  [40 %-100 %] 100 %  Set Rate:  [16 bmp-34 bmp] 34 bmp  Vt Set:  [550 mL] 550 mL  PEEP:  [5 cmH20-18 cmH20] 18 cmH20  Plateau Pressure:  [13 cmH20-35 cmH20] 29 cmH20     CT brain 3/6   1. No acute intracranial abnormalities.  Normal brain.        10/27/2017 ekg  Atrial fibrillation  Inferoposterior infarct, acute (RCA)  Probable RV involvement, suggest recording right precordial leads     3/6 TTE  - Left ventricle: The cavity size was normal. Wall thickness was    normal. Systolic function was moderately reduced. The estimated    ejection fraction was in the range of 35% to 40%. Severe    hypokinesis of the entireinferolateral myocardium; consistent    with ischemia or infarction in the distribution of the left    circumflex coronary artery. The study is not technically    sufficient to allow evaluation of LV diastolic function. Acoustic    contrast opacification  revealed no evidence ofthrombus.  - Mitral valve: Calcified annulus. There was mild regurgitation.  - Left atrium: The atrium was mildly dilated.  - Right ventricle: Systolic function was mildly reduced.           18-Oct-2017 06:33:09 EKG               Normal sinus rhythm  Nonspecific ST and T wave abnormality  Prolonged QT  Abnormal ECG  3/8/AXR  Airspace disease at the left greater than right bases. Esophageal  tube is looped upon itself in the left hemiabdomen with the tip  directed cephalad at overlying the left diaphragmatic region.  Gasless abdomen        chest X-ray 3/8  Endotracheal tube tip is about 4.4 cm superior to the carina.  Left-sided central venous catheter tip overlies the SVC. Esophageal  tube tip is below the diaphragm.   No change in confluent airspace disease within the left mid to lower  lung with patchy airspace disease in the right lung base. Stable  cardiomediastinal silhouette.     Results for VIKTOR, PHILIPP (MRN [id number]) as of 2017-11-09 09:44       Ref. Range   2017-11-09 04:00   11-09-2017 04:35   11-09-2017 04:45    pH, Arterial   Latest Ref Range: 7.350 - 7.450    7.079 (LL)       7.029 (LL)    pCO2 arterial   Latest Ref Range: 32.0 - 48.0 mmHg   62.7 (H)       57.5 (H)    pO2, Arterial   Latest Ref Range: 83.0 - 108.0 mmHg   415 (H)       355 (H)    Acid-base deficit   Latest Ref Range: 0.0 - 2.0 mmol/L   10.7 (H)       14.5 (H)    Bicarbonate   Latest Ref Range: 20.0 - 28.0 mmol/L   17.7 (L)       14.5 (L)             Results for HAI, GRABE (MRN [id number]) as of 11-09-17 09:44       Ref. Range   Nov 09, 2017 04:35   09-Nov-2017 04:45   11/09/2017 05:27    Sodium   Latest Ref Range: 135 - 145 mmol/L   138            Potassium   Latest Ref Range: 3.5 - 5.1 mmol/L   3.1 (L)            Chloride   Latest Ref Range: 101 - 111  mmol/L   98 (L)            CO2   Latest Ref Range: 22 - 32 mmol/L   14 (L)            Glucose   Latest Ref Range: 65 - 99 mg/dL   84            BUN   Latest Ref Range: 6 - 20 mg/dL   15            Creatinine   Latest Ref Range: 0.61 - 1.24 mg/dL   2.07 (H)            Calcium   Latest Ref Range: 8.9 - 10.3 mg/dL   7.6 (L)            Anion gap   Latest Ref Range: 5 - 15    26 (H)            Phosphorus   Latest Ref Range: 2.5 - 4.6 mg/dL   7.8 (H)            Magnesium   Latest Ref Range: 1.7 - 2.4 mg/dL   2.0            GFR, Est Non African American   Latest Ref Range: >60 mL/min  36 (L)            GFR, Est African American   Latest Ref Range: >60 mL/min   42 (L)            Lactic Acid, Venous   Latest Ref Range: 0.5 - 1.9 mmol/L           12.9 (HH)              CBC   Latest Ref Rng & Units   2017-10-25   10/18/2017   10/18/2017    WBC   4.0 - 10.5 K/uL   2.4(L)   -   9.7    Hemoglobin   13.0 - 17.0 g/dL   9.7(L)   10.5(L)   10.7(L)    Hematocrit   39.0 - 52.0 %   30.4(L)   31.0(L)   31.8(L)    Platelets   150 - 400 K/uL   207   -   216        BMP   Latest Ref Rng & Units   25-Oct-2017   10/18/2017   10/18/2017    Glucose   65 - 99 mg/dL   84   132(H)   126(H)    BUN   6 - 20 mg/dL   15   11   <5(L)    Creatinine   0.61 - 1.24 mg/dL   2.07(H)   1.00   0.56(L)    Sodium   135 - 145 mmol/L   138   133(L)   134(L)    Potassium   3.5 - 5.1 mmol/L   3.1(L)   3.4(L)   4.0    Chloride   101 - 111 mmol/L   98(L)   98(L)   102    CO2   22 - 32 mmol/L   14(L)   -   22    Calcium   8.9 - 10.3 mg/dL   7.6(L)   -   8.7(L)       Results for CALIL, AMOR (MRN [id number]) as of 10-25-17 09:44       Ref. Range   10/25/17 04:35    WBC   Latest  Ref Range: 4.0 - 10.5 K/uL   2.4 (L)    RBC   Latest Ref Range: 4.22 - 5.81 MIL/uL   3.24 (L)    Hemoglobin   Latest Ref Range: 13.0 - 17.0 g/dL   9.7 (L)    HCT   Latest Ref Range: 39.0 - 52.0 %   30.4 (L)    MCV   Latest Ref Range: 78.0 - 100.0 fL   93.8    MCH   Latest Ref Range: 26.0 - 34.0 pg   29.9    MCHC   Latest Ref Range: 30.0 - 36.0 g/dL   31.9    RDW   Latest Ref Range: 11.5 - 15.5 %   14.4    Platelets   Latest Ref Range: 150 - 400 K/uL   207    Valproic Acid,S   Latest Ref Range: 50.0 - 100.0 ug/mL   39 (L)    Glucose   Latest Ref Range: 65 - 99 mg/dL   84      Coronary/Graft Acute MI Revascularization    LEFT HEART CATH AND CORONARY ANGIOGRAPHY    11/02/2017     .Ost Cx lesion is 100% stenosed.   Colon Flattery LAD to Prox LAD lesion is 65% stenosed.   Marland Kitchen  Ost 1st Diag lesion is 90% stenosed.   .Prox LAD lesion is 65% stenosed.   Colon Flattery RCA to Prox RCA lesion is 80% stenosed.   .A drug-eluting stent was successfully placed using a STENT SYNERGY DES 3X24.   Marland KitchenPost intervention, there is a 0% residual stenosis.   .Mid Cx lesion is 90% stenosed.   Colon Flattery 2nd Mrg lesion is 70% stenosed.      1. Acute inferolateral STEMI secondary to proximal occlusion of the large, dominant Circumflex artery.   2. Successful PTCA/DES x 1 proximal Circumflex artery  3. Moderately severe stenosis in the proximal LAD and mid LAD  4. Residual stenosis in the mid AV groove Circumflex artery and ostium of the first OM branch.   5. Severe stenosis in the small, non-dominant RCA   Recommendations: He will be admitted to the ICU. PCCM to admit and begin cooling protocol. I will continue IV amiodarone. The IV Cangrelor will be continued for now and will be stopped once his Brilinta loading dose has been given. Rectal ASA will be given now. High dose statin will be started. Echo today to assess LVEF. I will ask the CHF/Shock team  to follow.   If he makes a full neurological recovery, will need repeat cardiac to consider PCI of the Circumflex and LAD.      ASSESSMENT / PLAN:   .47 Y/O DIABETIC MALE, SMOKER, S/P V FIB ARREST- ROSC < 5 MIN, POST LCX PCI /DES    .MULTIVESSEL CAD   .ACUTE RESPIRATORY FAILURE WITH MET/RESP ACIDOSIS    .R 3,4 LATERAL RIB FXR SEC TO CPR- NO EVIDENCE OF BAROTRAUMA   .AT RISK FOR ANOXIC ENCEPHALOPATHY   .STRESS>>LEUKOCYTOSIS   .HIGH LDL   .AT RISK FOR SHOCK LIVER AND ATN/AKI   .Hx Chronic neck pain   .H/o DM, hyperlipidemia, tobacco use         3/7 OVERNIGHT DEVELOPED ASPIRATION PNEUMONIA ? TUBE FEED ASPIRATION RESULTING IN ARDS, SEVERE HYPOXIA, NEW INFILTRATES IN BOTH LOWER LOBES, SEPTIC SHOCK, MET ACIDOSIS, AKI, CYTOPENIAS, ?COAGULOPATHY   Pt was proned for ventilation and oxygenation improved; AKI with metabolic acidosis - planned for HD-CRRT to be started.  Pt sustained CP arrest. ACLS protocol perfrmed. Pt did not respond. Family asked to stop further resuscitation. He expired peacefully.               LINES/TUBES:  3/5 ETT >>  3/5 LIJ CVL >>  3/5 OGT  3/5 Left Femoral  A line       Time spent for d/c summary process-25 minutes   Siva Anders Simmonds, MD  Pulmonary and [place] [facility]  Pager: [phone]

## 2017-11-12 NOTE — Procedures (Signed)
  Electroencephalogram report- LTM    Data acquisition: 10-20 electrode placement.  Additional T1, T2, and EKG electrodes; 26 channel digital referential acquisition reformatted to 18 channel/7 channel coronal bipolar     Spike detection: ON     Seizure detection: ON   Beginning time: 10/18/17 at 07 56 am  Ending time:11/09/2017 at 07 24 am   CPT: 95951 Day of study: day 3   This  intensive EEG monitoring with simultaneous video monitoring was performed for this patient s/p cardiac arest to rule out seizures.  Medications: as per EMR Intubated   Day 1 There was no pushbutton activations events during this recording.  Automated spike detection program did not detect any spikes. Seizure detection program did not detect any seizures .   The background marked by attenuated delta slowing at the beginning  of the recording gradualy become faster,  reaching 7 cps  with state changes present as well.   Day 2:T he background marked by 5- 7 cps slowing with state changes. Runs of right posterior (parietal-occipital)  spikes/wave discharges were present.  At time with evolving features  c/w electrographic seizures. These tend to resolve with ativan administration. Last electrographic seizure around 1 am. Occasionally right posterior IED still present.   Day 3: Burst suppression background, last electrographic seizure was around 9 am on 10/18/17.   Clinical interpretation: This day 3  of intensive EEG monitoring with simultaneous monitoring demonstrates burst suppression pattern ;  Last electrographic seizure around 9 am 10/18/17.

## 2017-11-12 NOTE — Progress Notes (Signed)
[facility] Brief Note  Pt with DM, out of hosp arrest, multivessel CAD on cath, seizures, prone ventilation.  S/p presumed aspiration of TF with metabolic decompensation past 24 hours Severe gap acidosis elev lactate. Pressor dep hypotension Oliguric AKI On peripheral bicarb drip and will leave that peripheral for frequent rate adjustments Will require CRRT. Orders in Full note to follow.  Note K low and likely to drop further with correction of acidosis. Will require frequent monitoring  Camille Balynthia Porter Moes, MD [facility] [phone] Pager 10/15/2017, 10:17 AM

## 2017-11-12 NOTE — Progress Notes (Signed)
Pt status began to deteriorate around 2230 04-18-18 with drop in BP and O2 saturations. RN continued to titrate levophed up until close to max and notified Elink MD Ogan of pt status. ABGs were ran and orders were received from MD Hammonds and NP Hoffman based on results. Pt was given biacarb and ventilator settings were adjusted in attempt to improve ABG results. Pt was started on vasopressin at 0.03 and epi at 15mcg. O2 sats continued to remain low despite changes and MD placed order to paralyze and prone pt. Paralytic started at 0205 and pt proned at 0340 with immediate improvement in O2 sat but continued to drop blood pressure-MAPs 30/40s. 1.1 amps of epinephrine were given and neo was added and started at 400 mcg. Since then pt's BP has been stable and RN has been able to titrate down neo.

## 2017-11-12 DEATH — deceased

## 2019-11-20 IMAGING — DX DG CHEST 1V PORT
1 series · 1 of 1 positions shown · non-contrast
Comparison: 10/18/2017, 10/17/2017, 10/16/2017

CLINICAL DATA: ETT placement

EXAM:
PORTABLE CHEST 1 VIEW

[chest ap]
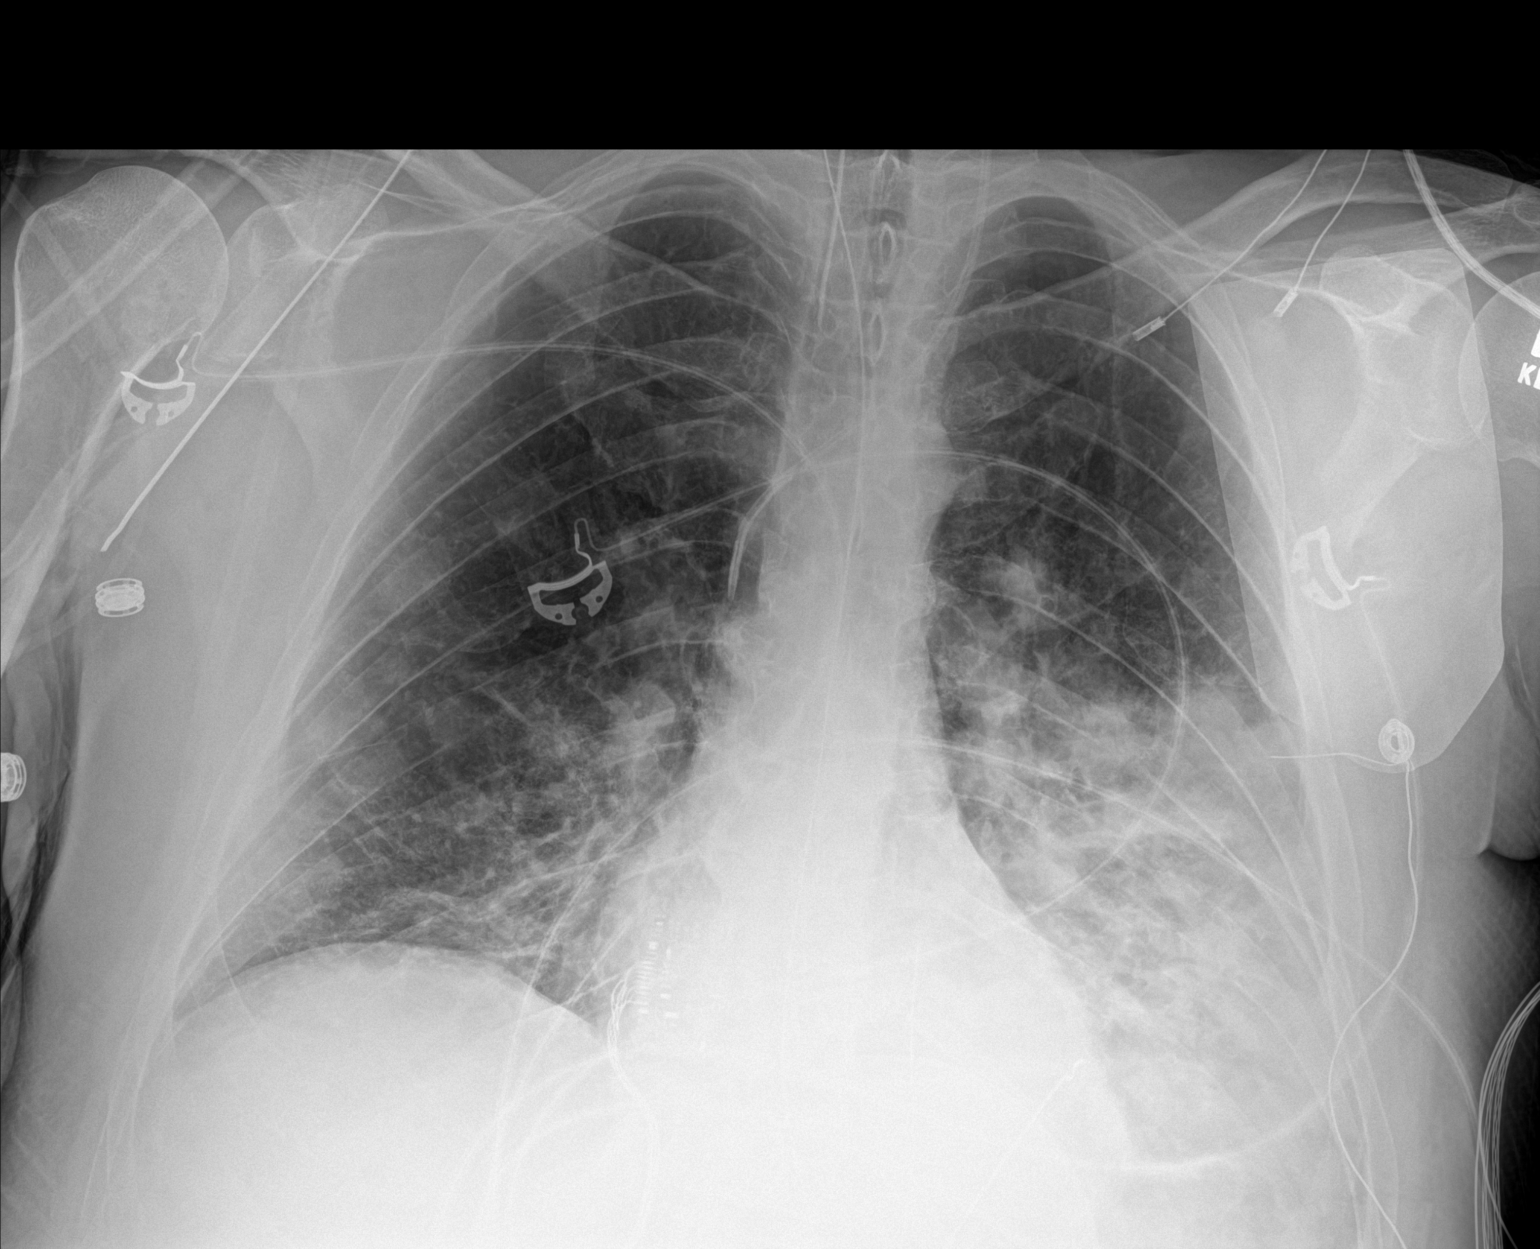

[1 of 1 positions shown; findings below may reference images not displayed]

FINDINGS: Endotracheal tube tip is about 7.5 cm superior to carina. Left-sided
central venous catheter tip overlies the SVC. Esophageal tube has
been advanced, but the tip is not clearly visible. Slightly improved
aeration of the right lung base. Persistent confluent airspace
disease in the left mid and lower lung. Possible nodular opacities
in the peripheral right lung. Stable cardiomediastinal silhouette.
Multiple right rib fractures.
IMPRESSION: 1. Endotracheal tube tip about 7.5 cm superior to carina
2. Esophageal tube has been advanced, tip probably below the
diaphragm but not identified with certainty
3. Slightly improved aeration of the right base with residual
streaky infiltrate noted. Persistent confluent airspace disease in
the left mid and lower lung
4. Possible nodules in the right mid and lower lung; could further
evaluate with chest CT.

## 2019-11-20 IMAGING — DX DG CHEST 1V PORT
1 series · 1 of 1 positions shown · non-contrast
Comparison: 10/19/2017, 10/18/2017, 10/17/2017, 10/16/2017

CLINICAL DATA: Adjustment of endotracheal tube

EXAM:
PORTABLE CHEST 1 VIEW

[chest ap]
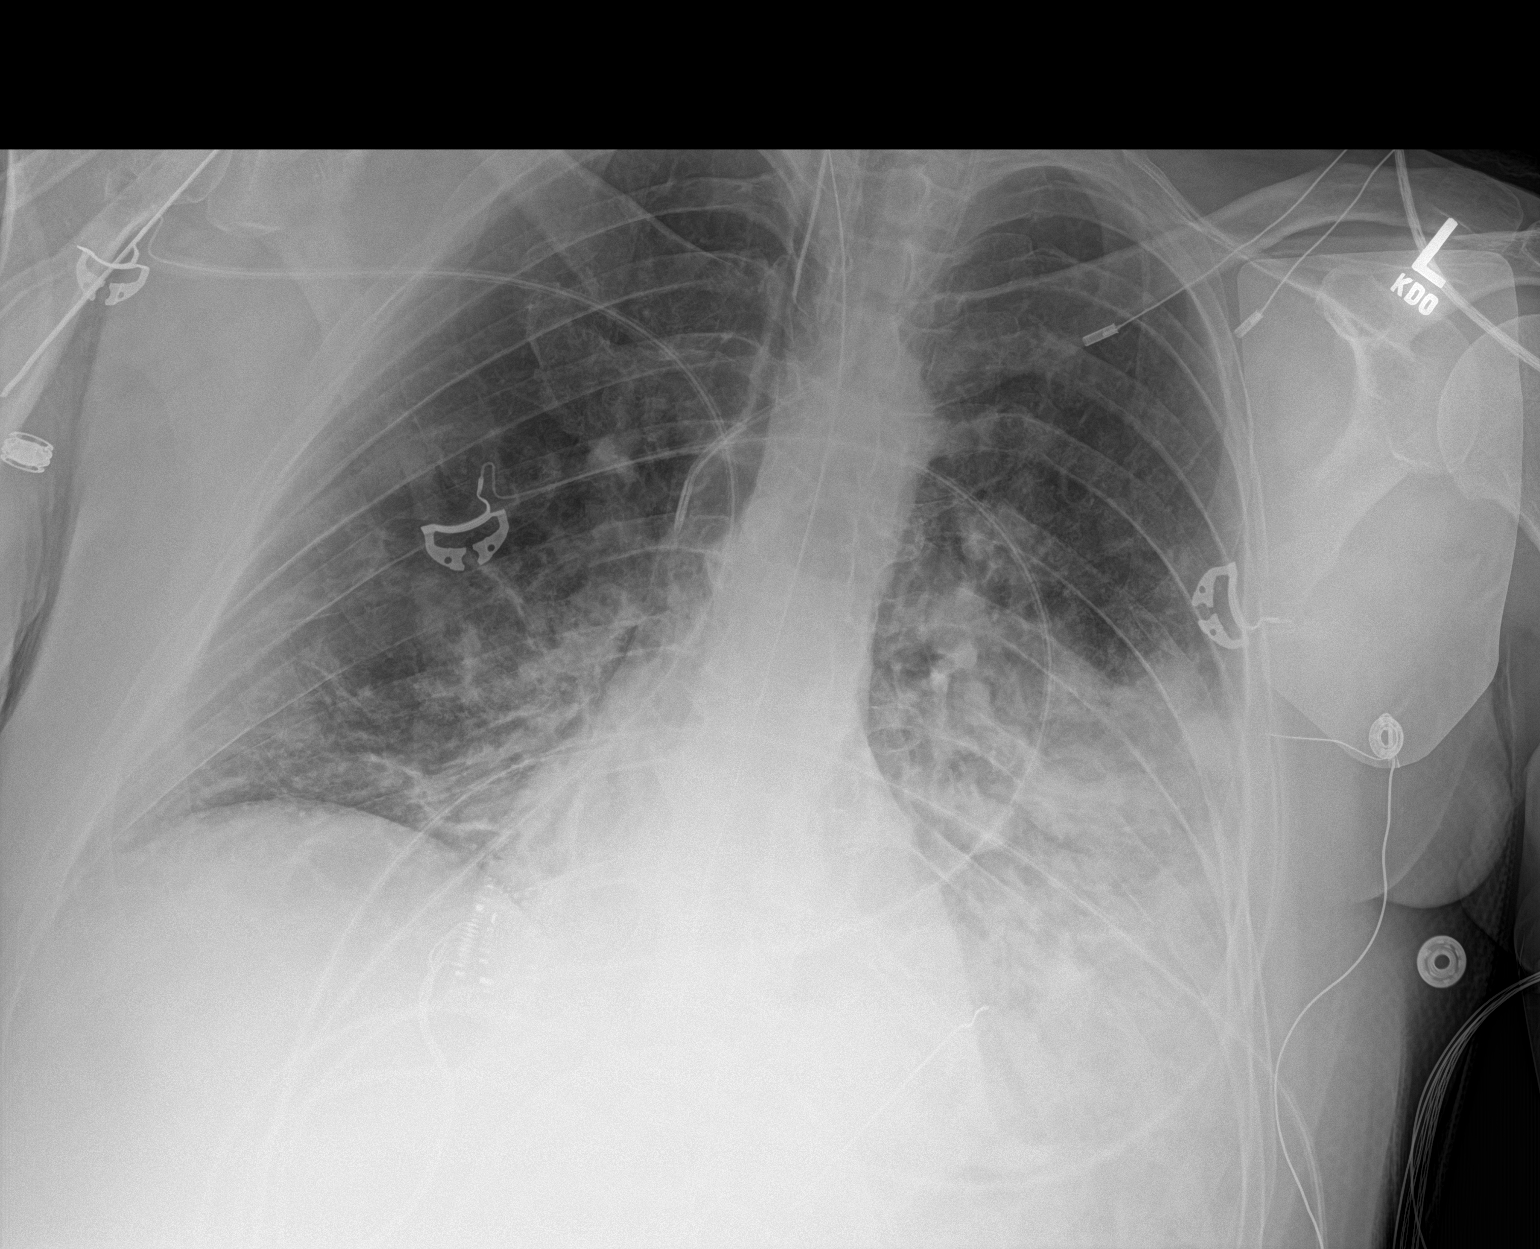

[1 of 1 positions shown; findings below may reference images not displayed]

FINDINGS: Endotracheal tube tip is about 4.4 cm superior to the carina.
Left-sided central venous catheter tip overlies the SVC. Esophageal
tube tip is below the diaphragm.

No change in confluent airspace disease within the left mid to lower
lung with patchy airspace disease in the right lung base. Stable
cardiomediastinal silhouette.
IMPRESSION: 1. Endotracheal tube tip about 4.4 cm superior to carina
2. No significant change in the left greater than right bilateral
airspace disease.
